# Patient Record
Sex: Female | Born: 1945 | Race: White | Hispanic: No | Marital: Married | Smoking: Never smoker
Health system: Southern US, Community
[De-identification: ages and names within clinical notes are randomized; demographics above are authoritative.]

## PROBLEM LIST (undated history)

## (undated) DIAGNOSIS — E079 Disorder of thyroid, unspecified: Secondary | ICD-10-CM

## (undated) DIAGNOSIS — N200 Calculus of kidney: Secondary | ICD-10-CM

## (undated) DIAGNOSIS — Z87442 Personal history of urinary calculi: Secondary | ICD-10-CM

## (undated) DIAGNOSIS — E7800 Pure hypercholesterolemia, unspecified: Secondary | ICD-10-CM

## (undated) DIAGNOSIS — I10 Essential (primary) hypertension: Secondary | ICD-10-CM

## (undated) HISTORY — DX: Calculus of kidney: N20.0

## (undated) HISTORY — PX: ABDOMINAL HYSTERECTOMY: SHX81

## (undated) HISTORY — DX: Pure hypercholesterolemia, unspecified: E78.00

## (undated) HISTORY — DX: Disorder of thyroid, unspecified: E07.9

---

## 2001-03-02 ENCOUNTER — Other Ambulatory Visit: Admission: RE | Admit: 2001-03-02 | Discharge: 2001-03-02 | Payer: Self-pay | Admitting: Dermatology

## 2017-03-24 ENCOUNTER — Ambulatory Visit (INDEPENDENT_AMBULATORY_CARE_PROVIDER_SITE_OTHER): Payer: Medicare Other | Admitting: Otolaryngology

## 2017-03-24 DIAGNOSIS — R1312 Dysphagia, oropharyngeal phase: Secondary | ICD-10-CM | POA: Diagnosis not present

## 2017-03-24 DIAGNOSIS — D440 Neoplasm of uncertain behavior of thyroid gland: Secondary | ICD-10-CM | POA: Diagnosis not present

## 2017-03-30 ENCOUNTER — Other Ambulatory Visit (INDEPENDENT_AMBULATORY_CARE_PROVIDER_SITE_OTHER): Payer: Self-pay | Admitting: Otolaryngology

## 2017-03-30 DIAGNOSIS — E079 Disorder of thyroid, unspecified: Secondary | ICD-10-CM

## 2017-04-07 ENCOUNTER — Ambulatory Visit (HOSPITAL_COMMUNITY)
Admission: RE | Admit: 2017-04-07 | Discharge: 2017-04-07 | Disposition: A | Discharge status: Home / Self Care | Payer: Medicare Other | Source: Ambulatory Visit | Attending: Otolaryngology | Admitting: Otolaryngology

## 2017-04-07 ENCOUNTER — Encounter (HOSPITAL_COMMUNITY): Payer: Self-pay

## 2017-04-07 DIAGNOSIS — I708 Atherosclerosis of other arteries: Secondary | ICD-10-CM | POA: Diagnosis not present

## 2017-04-07 DIAGNOSIS — E079 Disorder of thyroid, unspecified: Secondary | ICD-10-CM

## 2017-04-07 DIAGNOSIS — E042 Nontoxic multinodular goiter: Secondary | ICD-10-CM | POA: Diagnosis not present

## 2017-04-07 HISTORY — DX: Essential (primary) hypertension: I10

## 2017-04-07 LAB — POCT I-STAT CREATININE: CREATININE: 0.9 mg/dL (ref 0.44–1.00)

## 2017-04-07 MED ORDER — IOPAMIDOL (ISOVUE-300) INJECTION 61%
75.0000 mL | Freq: Once | INTRAVENOUS | Status: AC | PRN
  Administered 2017-04-07: 75 mL via INTRAVENOUS

## 2017-04-21 ENCOUNTER — Ambulatory Visit (INDEPENDENT_AMBULATORY_CARE_PROVIDER_SITE_OTHER): Payer: Medicare Other | Admitting: Otolaryngology

## 2017-04-21 DIAGNOSIS — D440 Neoplasm of uncertain behavior of thyroid gland: Secondary | ICD-10-CM

## 2017-04-26 ENCOUNTER — Other Ambulatory Visit (INDEPENDENT_AMBULATORY_CARE_PROVIDER_SITE_OTHER): Payer: Self-pay | Admitting: Otolaryngology

## 2017-04-26 DIAGNOSIS — E041 Nontoxic single thyroid nodule: Secondary | ICD-10-CM

## 2017-04-27 ENCOUNTER — Other Ambulatory Visit (INDEPENDENT_AMBULATORY_CARE_PROVIDER_SITE_OTHER): Payer: Self-pay | Admitting: Otolaryngology

## 2017-04-27 DIAGNOSIS — E041 Nontoxic single thyroid nodule: Secondary | ICD-10-CM

## 2017-06-01 ENCOUNTER — Ambulatory Visit (HOSPITAL_COMMUNITY): Payer: Medicare Other

## 2017-06-02 ENCOUNTER — Ambulatory Visit (HOSPITAL_COMMUNITY)
Admission: RE | Admit: 2017-06-02 | Discharge: 2017-06-02 | Disposition: A | Discharge status: Home / Self Care | Payer: Medicare Other | Source: Ambulatory Visit | Attending: Otolaryngology | Admitting: Otolaryngology

## 2017-06-02 DIAGNOSIS — E041 Nontoxic single thyroid nodule: Secondary | ICD-10-CM | POA: Diagnosis present

## 2017-06-08 ENCOUNTER — Other Ambulatory Visit (INDEPENDENT_AMBULATORY_CARE_PROVIDER_SITE_OTHER): Payer: Self-pay | Admitting: Otolaryngology

## 2017-06-08 DIAGNOSIS — E041 Nontoxic single thyroid nodule: Secondary | ICD-10-CM

## 2017-06-09 ENCOUNTER — Encounter (HOSPITAL_COMMUNITY): Payer: Self-pay

## 2017-06-09 ENCOUNTER — Ambulatory Visit (HOSPITAL_COMMUNITY)
Admission: RE | Admit: 2017-06-09 | Discharge: 2017-06-09 | Disposition: A | Discharge status: Home / Self Care | Payer: Medicare Other | Source: Ambulatory Visit | Attending: Otolaryngology | Admitting: Otolaryngology

## 2017-06-09 DIAGNOSIS — E041 Nontoxic single thyroid nodule: Secondary | ICD-10-CM | POA: Diagnosis present

## 2017-06-09 MED ORDER — LIDOCAINE HCL (PF) 1 % IJ SOLN
INTRAMUSCULAR | Status: AC
  Administered 2017-06-09: 5 mL
  Filled 2017-06-09: qty 10

## 2017-06-09 MED ORDER — LIDOCAINE HCL (PF) 1 % IJ SOLN
INTRAMUSCULAR | Status: AC
  Filled 2017-06-09: qty 10

## 2017-06-09 NOTE — Discharge Instructions (Signed)
Thyroid Biopsy °The thyroid gland is a butterfly-shaped gland located in the front of the neck. It produces hormones that affect metabolism, growth and development, and body temperature. Thyroid biopsy is a procedure in which small samples of tissue or fluid are removed from the thyroid gland. The samples are then looked at under a microscope to check for abnormalities. This procedure is done to determine the cause of thyroid problems. It may be done to check for infection, cancer, or other thyroid problems. °Two methods may be used for a thyroid biopsy. In one method, a thin needle is inserted through the skin and into the thyroid gland. In the other method, an open incision is made through the skin. °Tell a health care provider about: °· Any allergies you have. °· All medicines you are taking, including vitamins, herbs, eye drops, creams, and over-the-counter medicines. °· Any problems you or family members have had with anesthetic medicines. °· Any blood disorders you have. °· Any surgeries you have had. °· Any medical conditions you have. °What are the risks? °Generally, this is a safe procedure. However, problems can occur and include: °· Bleeding from the procedure site. °· Infection. °· Injury to structures near the thyroid gland. ° °What happens before the procedure? °· Ask your health care provider about: °? Changing or stopping your regular medicines. This is especially important if you are taking diabetes medicines or blood thinners. °? Taking medicines such as aspirin and ibuprofen. These medicines can thin your blood. Do not take these medicines before your procedure if your health care provider asks you not to. °· Do not eat or drink anything after midnight on the night before the procedure or as directed by your health care provider. °· You may have a blood sample taken. °What happens during the procedure? °Either of these methods may be used to perform a thyroid biopsy: °· Fine needle biopsy. You may  be given medicine to help you relax (sedative). You will be asked to lie on your back with your head tipped backward to extend your neck. An area on your neck will be cleaned. A needle will then be inserted through the skin of your neck. You may be asked to avoid coughing, talking, swallowing, or making sounds during some portions of the procedure. The needle will be withdrawn once the tissue or fluid samples have been removed. Pressure may be applied to your neck to reduce swelling and ensure that bleeding has stopped. The samples will be sent to a lab for examination. °· Open biopsy. You will be given medicine to make you sleep (general anesthetic). An incision will be made in your neck. A sample of thyroid tissue will be removed using surgical tools. The tissue sample will be sent for examination. In some cases, the sample may be examined during the biopsy. If that is done and cancer cells are found, some or all of the thyroid gland may be removed. The incision will be closed with stitches. ° °What happens after the procedure? °· Your recovery will be assessed and monitored. °· You may have soreness and tenderness at the site of the biopsy. This should go away after a few days. °· If you had an open biopsy, you may have a hoarse voice or sore throat for a couple days. °· It is your responsibility to get your test results. °This information is not intended to replace advice given to you by your health care provider. Make sure you discuss any questions you have with   your health care provider. °Document Released: 07/04/2007 Document Revised: 05/09/2016 Document Reviewed: 11/29/2013 °Elsevier Interactive Patient Education © 2018 [employer]vier Inc. ° °

## 2018-05-30 ENCOUNTER — Other Ambulatory Visit (INDEPENDENT_AMBULATORY_CARE_PROVIDER_SITE_OTHER): Payer: Self-pay | Admitting: Otolaryngology

## 2018-05-30 DIAGNOSIS — E041 Nontoxic single thyroid nodule: Secondary | ICD-10-CM

## 2018-06-13 ENCOUNTER — Ambulatory Visit (HOSPITAL_COMMUNITY)
Admission: RE | Admit: 2018-06-13 | Discharge: 2018-06-13 | Disposition: A | Discharge status: Home / Self Care | Payer: Medicare Other | Source: Ambulatory Visit | Attending: Otolaryngology | Admitting: Otolaryngology

## 2018-06-13 DIAGNOSIS — E041 Nontoxic single thyroid nodule: Secondary | ICD-10-CM | POA: Insufficient documentation

## 2018-06-15 ENCOUNTER — Ambulatory Visit (INDEPENDENT_AMBULATORY_CARE_PROVIDER_SITE_OTHER): Payer: Medicare Other | Admitting: Otolaryngology

## 2018-06-15 DIAGNOSIS — D440 Neoplasm of uncertain behavior of thyroid gland: Secondary | ICD-10-CM | POA: Diagnosis not present

## 2019-06-14 ENCOUNTER — Ambulatory Visit (INDEPENDENT_AMBULATORY_CARE_PROVIDER_SITE_OTHER): Payer: Medicare Other | Admitting: Otolaryngology

## 2019-06-14 DIAGNOSIS — D440 Neoplasm of uncertain behavior of thyroid gland: Secondary | ICD-10-CM | POA: Diagnosis not present

## 2019-10-02 DIAGNOSIS — L509 Urticaria, unspecified: Secondary | ICD-10-CM | POA: Diagnosis not present

## 2019-10-02 DIAGNOSIS — I10 Essential (primary) hypertension: Secondary | ICD-10-CM | POA: Diagnosis not present

## 2019-10-02 DIAGNOSIS — R300 Dysuria: Secondary | ICD-10-CM | POA: Diagnosis not present

## 2019-10-02 DIAGNOSIS — N390 Urinary tract infection, site not specified: Secondary | ICD-10-CM | POA: Diagnosis not present

## 2019-10-02 DIAGNOSIS — R5383 Other fatigue: Secondary | ICD-10-CM | POA: Diagnosis not present

## 2019-10-02 DIAGNOSIS — Z299 Encounter for prophylactic measures, unspecified: Secondary | ICD-10-CM | POA: Diagnosis not present

## 2019-10-02 DIAGNOSIS — Z789 Other specified health status: Secondary | ICD-10-CM | POA: Diagnosis not present

## 2019-10-18 DIAGNOSIS — L509 Urticaria, unspecified: Secondary | ICD-10-CM | POA: Diagnosis not present

## 2019-10-18 DIAGNOSIS — Z713 Dietary counseling and surveillance: Secondary | ICD-10-CM | POA: Diagnosis not present

## 2019-10-18 DIAGNOSIS — Z299 Encounter for prophylactic measures, unspecified: Secondary | ICD-10-CM | POA: Diagnosis not present

## 2019-10-18 DIAGNOSIS — I10 Essential (primary) hypertension: Secondary | ICD-10-CM | POA: Diagnosis not present

## 2019-11-14 ENCOUNTER — Other Ambulatory Visit (HOSPITAL_COMMUNITY)
Admission: AD | Admit: 2019-11-14 | Discharge: 2019-11-14 | Disposition: A | Discharge status: Home / Self Care | Payer: Medicare Other | Source: Other Acute Inpatient Hospital | Attending: Urology | Admitting: Urology

## 2019-11-14 ENCOUNTER — Other Ambulatory Visit: Payer: Self-pay

## 2019-11-14 ENCOUNTER — Encounter: Payer: Self-pay | Admitting: Urology

## 2019-11-14 ENCOUNTER — Ambulatory Visit (INDEPENDENT_AMBULATORY_CARE_PROVIDER_SITE_OTHER): Payer: Medicare Other | Admitting: Urology

## 2019-11-14 ENCOUNTER — Other Ambulatory Visit: Payer: Self-pay | Admitting: Urology

## 2019-11-14 VITALS — BP 164/75 | HR 96 | Temp 98.7°F | Ht 68.0 in | Wt 195.0 lb

## 2019-11-14 DIAGNOSIS — R300 Dysuria: Secondary | ICD-10-CM | POA: Diagnosis not present

## 2019-11-14 DIAGNOSIS — R310 Gross hematuria: Secondary | ICD-10-CM

## 2019-11-14 DIAGNOSIS — N3021 Other chronic cystitis with hematuria: Secondary | ICD-10-CM | POA: Diagnosis not present

## 2019-11-14 LAB — POCT URINALYSIS DIPSTICK
Glucose, UA: NEGATIVE
Ketones, UA: NEGATIVE
Nitrite, UA: NEGATIVE
Protein, UA: NEGATIVE
Spec Grav, UA: 1.025 (ref 1.010–1.025)
Urobilinogen, UA: NEGATIVE E.U./dL — AB
pH, UA: 5 (ref 5.0–8.0)

## 2019-11-14 LAB — BLADDER SCAN AMB NON-IMAGING: Scan Result: 213.6

## 2019-11-14 NOTE — Progress Notes (Signed)
11/14/2019 4:03 PM   Amanda Case [birth year] [id number]  Referring provider: Monico Blitz, MD [place],  [place] [id number]  Chronic cystitis  HPI: Amanda Case is a 74yo here for evaluation for chronic cystitis, dysuria and gross hematuria. She gets 4-5 UTIs per year starting 3 years ago. She gets worsening LUTS especially dysuria with UTIs. She had 1 episode of gross painless hematuria 6 months ago. She had nephrolithiasis 18 months ago and had CT stone study at [facility]. No tobacco abuse hx. No exposure risks.    PMH: Past Medical History:  Diagnosis Date  . Hypercholesterolemia   . Hypertension   . Kidney stone     Surgical History:   Home Medications:  Allergies as of 11/14/2019      Reactions   Cipro [ciprofloxacin-ciproflox Hcl Er] Hives      Medication List       Accurate as of November 14, 2019  4:03 PM. If you have any questions, ask your nurse or doctor.        hydrochlorothiazide 12.5 MG capsule Commonly known as: MICROZIDE Take 12.5 mg by mouth daily.   losartan 100 MG tablet Commonly known as: COZAAR Take 100 mg by mouth daily.   rosuvastatin 10 MG tablet Commonly known as: CRESTOR Take 10 mg by mouth daily.       Allergies:  Allergies  Allergen Reactions  . Cipro [Ciprofloxacin-Ciproflox Hcl Er] Hives    Family History: No family history on file.  Social History:  reports that she has never smoked. She has never used smokeless tobacco. She reports that she does not drink alcohol or use drugs.  ROS: All other review of systems were reviewed and are negative except what is noted above in HPI  Physical Exam: BP (!) 164/75   Pulse 96   Temp 98.7 F (37.1 C)   Ht 5\' 8"  (1.727 m)   Wt 195 lb (88.5 kg)   BMI 29.65 kg/m   Constitutional:  Alert and oriented, No acute distress. HEENT: [place] AT, moist mucus membranes.  Trachea midline, no masses. Cardiovascular: No clubbing, cyanosis, or edema. Respiratory: Normal respiratory  effort, no increased work of breathing. GI: Abdomen is soft, nontender, nondistended, no abdominal masses GU: No CVA tenderness Lymph: No cervical or inguinal lymphadenopathy. Skin: No rashes, bruises or suspicious lesions. Neurologic: Grossly intact, no focal deficits, moving all 4 extremities. Psychiatric: Normal mood and affect.  Laboratory Data: No results found for: WBC, HGB, HCT, MCV, PLT  Lab Results  Component Value Date   CREATININE 0.90 04/07/2017    No results found for: PSA  No results found for: TESTOSTERONE  No results found for: HGBA1C  Urinalysis    Component Value Date/Time   BILIRUBINUR small 11/14/2019 1557   PROTEINUR Negative 11/14/2019 1557   UROBILINOGEN negative (A) 11/14/2019 1557   NITRITE neg 11/14/2019 1557   LEUKOCYTESUR Large (3+) (A) 11/14/2019 1557    No results found for: LABMICR, WBCUA, RBCUA, LABEPIT, MUCUS, BACTERIA  Pertinent Imaging:  No results found for this or any previous visit. No results found for this or any previous visit. No results found for this or any previous visit. No results found for this or any previous visit. No results found for this or any previous visit. No results found for this or any previous visit. No results found for this or any previous visit. No results found for this or any previous visit.  Assessment & Plan:    1.  Dysuria -urine for culture, will call with results - Bladder Scan (Post Void Residual) in office - POCT urinalysis dipstick  2. Chronic cystitis with hematuria -BMP -CT hematuria -Office cystoscopy   No follow-ups on file.  Nicolette Bang, MD  [facility] [place]

## 2019-11-14 NOTE — Patient Instructions (Signed)
Hematuria, Adult Hematuria is blood in the urine. Blood may be visible in the urine, or it may be identified with a test. This condition can be caused by infections of the bladder, urethra, kidney, or prostate. Other possible causes include:  Kidney stones.  Cancer of the urinary tract.  Too much calcium in the urine.  Conditions that are passed from parent to child (inherited conditions).  Exercise that requires a lot of energy. Infections can usually be treated with medicine, and a kidney stone usually will pass through your urine. If neither of these is the cause of your hematuria, more tests may be needed to identify the cause of your symptoms. It is very important to tell your health care provider about any blood in your urine, even if it is painless or the blood stops without treatment. Blood in the urine, when it happens and then stops and then happens again, can be a symptom of a very serious condition, including cancer. There is no pain in the initial stages of many urinary cancers. Follow these instructions at home: Medicines  Take over-the-counter and prescription medicines only as told by your health care provider.  If you were prescribed an antibiotic medicine, take it as told by your health care provider. Do not stop taking the antibiotic even if you start to feel better. Eating and drinking  Drink enough fluid to keep your urine clear or pale yellow. It is recommended that you drink 3-4 quarts (2.8-3.8 L) a day. If you have been diagnosed with an infection, it is recommended that you drink cranberry juice in addition to large amounts of water.  Avoid caffeine, tea, and carbonated beverages. These tend to irritate the bladder.  Avoid alcohol because it may irritate the prostate (men). General instructions  If you have been diagnosed with a kidney stone, follow your health care provider's instructions about straining your urine to catch the stone.  Empty your bladder  often. Avoid holding urine for long periods of time.  If you are female: ? After a bowel movement, wipe from front to back and use each piece of toilet paper only once. ? Empty your bladder before and after sex.  Pay attention to any changes in your symptoms. Tell your health care provider about any changes or any new symptoms.  It is your responsibility to get your test results. Ask your health care provider, or the department performing the test, when your results will be ready.  Keep all follow-up visits as told by your health care provider. This is important. Contact a health care provider if:  You develop back pain.  You have a fever.  You have nausea or vomiting.  Your symptoms do not improve after 3 days.  Your symptoms get worse. Get help right away if:  You develop severe vomiting and are unable take medicine without vomiting.  You develop severe pain in your back or abdomen even though you are taking medicine.  You pass a large amount of blood in your urine.  You pass blood clots in your urine.  You feel very weak or like you might faint.  You faint. Summary  Hematuria is blood in the urine. It has many possible causes.  It is very important that you tell your health care provider about any blood in your urine, even if it is painless or the blood stops without treatment.  Take over-the-counter and prescription medicines only as told by your health care provider.  Drink enough fluid to keep   your urine clear or pale yellow. This information is not intended to replace advice given to you by your health care provider. Make sure you discuss any questions you have with your health care provider. Document Revised: 01/31/2019 Document Reviewed: 10/09/2016 Elsevier Patient Education  2020 Elsevier Inc.  

## 2019-11-14 NOTE — Progress Notes (Signed)
Urological Symptom Review  Patient is experiencing the following symptoms: Frequent urination Hard to postpone urination Burning/pain with urination Get up at night to urinate Leakage of urine Stream starts and stops Urinary tract infection   Review of Systems  Gastrointestinal (upper)  : Negative for upper GI symptoms  Gastrointestinal (lower) : Negative for lower GI symptoms  Constitutional : Negative for symptoms  Skin: Negative for skin symptoms  Eyes: Negative for eye symptoms  Ear/Nose/Throat : Negative for Ear/Nose/Throat symptoms  Hematologic/Lymphatic: Negative for Hematologic/Lymphatic symptoms  Cardiovascular : Negative for cardiovascular symptoms  Respiratory : Negative for respiratory symptoms  Endocrine: Negative for endocrine symptoms  Musculoskeletal: Negative for musculoskeletal symptoms  Neurological: Negative for neurological symptoms  Psychologic: Negative for psychiatric symptoms

## 2019-11-15 LAB — URINE CULTURE: Culture: >=100000 — AB

## 2019-11-15 LAB — BASIC METABOLIC PANEL
BUN/Creatinine Ratio: 11 (calc) (ref 6–22)
BUN: 11 mg/dL (ref 7–25)
CO2: 29 mmol/L (ref 20–32)
Calcium: 10.1 mg/dL (ref 8.6–10.4)
Chloride: 101 mmol/L (ref 98–110)
Creat: 1 mg/dL — ABNORMAL HIGH (ref 0.60–0.93)
Glucose, Bld: 119 mg/dL (ref 65–139)
Potassium: 4.4 mmol/L (ref 3.5–5.3)
Sodium: 140 mmol/L (ref 135–146)

## 2019-11-16 ENCOUNTER — Other Ambulatory Visit: Payer: Self-pay

## 2019-11-16 ENCOUNTER — Telehealth: Payer: Self-pay

## 2019-11-16 DIAGNOSIS — R300 Dysuria: Secondary | ICD-10-CM

## 2019-11-16 MED ORDER — SULFAMETHOXAZOLE-TRIMETHOPRIM 800-160 MG PO TABS: 1.0000 | ORAL_TABLET | Freq: Two times a day (BID) | ORAL | 0 refills | Status: DC

## 2019-11-16 NOTE — Telephone Encounter (Signed)
-----   Message from Dorisann Frames, RN sent at 11/16/2019  2:05 PM EST ----- Urine culture

## 2019-11-16 NOTE — Telephone Encounter (Signed)
I spoke with Dr. Alyson Ingles for pt urine culture. Order received verbal for Bactrim DS po BID #[id number]. Pt notified. rx sent in.

## 2019-11-16 NOTE — Progress Notes (Signed)
Letter mailed of BMP results

## 2019-11-23 ENCOUNTER — Telehealth: Payer: Self-pay | Admitting: Urology

## 2019-11-23 NOTE — Telephone Encounter (Signed)
Patient requests a nurse return her call. She has questions about Cipro and also a CT question.

## 2019-11-23 NOTE — Telephone Encounter (Signed)
Left message to return call 

## 2019-11-23 NOTE — Telephone Encounter (Signed)
Pt had questions about CT scheduling. Authorization was approved on 3/2. Notified pt they should call within the week but number given to pt to call scheduling if she has not heard by next Friday. Pt voiced understanding.

## 2019-12-06 ENCOUNTER — Other Ambulatory Visit: Payer: Self-pay

## 2019-12-06 ENCOUNTER — Ambulatory Visit (HOSPITAL_COMMUNITY)
Admission: RE | Admit: 2019-12-06 | Discharge: 2019-12-06 | Disposition: A | Discharge status: Home / Self Care | Payer: Medicare Other | Source: Ambulatory Visit | Attending: Urology | Admitting: Urology

## 2019-12-06 ENCOUNTER — Ambulatory Visit (HOSPITAL_COMMUNITY): Admission: RE | Admit: 2019-12-06 | Payer: Medicare Other | Source: Ambulatory Visit

## 2019-12-06 DIAGNOSIS — R310 Gross hematuria: Secondary | ICD-10-CM | POA: Insufficient documentation

## 2019-12-06 DIAGNOSIS — K5730 Diverticulosis of large intestine without perforation or abscess without bleeding: Secondary | ICD-10-CM | POA: Diagnosis not present

## 2019-12-06 DIAGNOSIS — R319 Hematuria, unspecified: Secondary | ICD-10-CM | POA: Diagnosis not present

## 2019-12-06 MED ORDER — IOHEXOL 300 MG/ML  SOLN
150.0000 mL | Freq: Once | INTRAMUSCULAR | Status: AC | PRN
  Administered 2019-12-06: 13:00:00 150 mL via INTRAVENOUS

## 2020-01-02 ENCOUNTER — Ambulatory Visit: Payer: Medicare Other | Admitting: Urology

## 2020-01-02 ENCOUNTER — Other Ambulatory Visit: Payer: Self-pay

## 2020-01-02 ENCOUNTER — Encounter: Payer: Self-pay | Admitting: Urology

## 2020-01-02 VITALS — BP 156/71 | HR 80 | Temp 98.2°F | Ht 68.0 in | Wt 195.0 lb

## 2020-01-02 DIAGNOSIS — N3021 Other chronic cystitis with hematuria: Secondary | ICD-10-CM | POA: Diagnosis not present

## 2020-01-02 LAB — POCT URINALYSIS DIPSTICK
Bilirubin, UA: NEGATIVE
Blood, UA: NEGATIVE
Glucose, UA: NEGATIVE
Ketones, UA: NEGATIVE
Nitrite, UA: NEGATIVE
Protein, UA: NEGATIVE
Spec Grav, UA: <=1.005 — AB (ref 1.010–1.025)
Urobilinogen, UA: NEGATIVE E.U./dL — AB
pH, UA: 7.5 (ref 5.0–8.0)

## 2020-01-02 LAB — BLADDER SCAN AMB NON-IMAGING: Scan Result: 397.3

## 2020-01-02 MED ORDER — NITROFURANTOIN MACROCRYSTAL 50 MG PO CAPS: 50.0000 mg | ORAL_CAPSULE | Freq: Every evening | ORAL | 3 refills | Status: DC

## 2020-01-02 NOTE — Progress Notes (Signed)
   01/02/20  CC: recurrent UTI  HPI: Ms Orcutt is a 74yo here for followup for recurrent UTI. CT hematuria did not show GU abnormalities. She does have a large 15cm pelvic mass compressing bladder.   Blood pressure (!) 156/71, pulse 80, temperature 98.2 F (36.8 C), height 5\' 8"  (1.727 m), weight 195 lb (88.5 kg). NED. A&Ox3.   No respiratory distress   Abd soft, NT, ND Normal external genitalia with patent urethral meatus  Cystoscopy Procedure Note  Patient identification was confirmed, informed consent was obtained, and patient was prepped using Betadine solution.  Lidocaine jelly was administered per urethral meatus.    Procedure: - Flexible cystoscope introduced, without any difficulty.   - Thorough search of the bladder revealed:    normal urethral meatus    normal urothelium    no stones    no ulcers     no tumors    no urethral polyps    no trabeculation -Bladder compressed at dome from pelvic mass.   - Ureteral orifices were normal in position and appearance.  Post-Procedure: - Patient tolerated the procedure well  Assessment/ Plan: -I discussed the Ct findings with the patient. I instructed her to call her Gynecologist in [place] to schedule appointment to discuss uterine mass.   RTC 3 months   No follow-ups on file.  Nicolette Bang, MD

## 2020-01-02 NOTE — Progress Notes (Signed)

## 2020-01-02 NOTE — Patient Instructions (Signed)
Urinary Tract Infection, Adult A urinary tract infection (UTI) is an infection of any part of the urinary tract. The urinary tract includes:  The kidneys.  The ureters.  The bladder.  The urethra. These organs make, store, and get rid of pee (urine) in the body. What are the causes? This is caused by germs (bacteria) in your genital area. These germs grow and cause swelling (inflammation) of your urinary tract. What increases the risk? You are more likely to develop this condition if:  You have a small, thin tube (catheter) to drain pee.  You cannot control when you pee or poop (incontinence).  You are female, and: ? You use these methods to prevent pregnancy:  A medicine that kills sperm (spermicide).  A device that blocks sperm (diaphragm). ? You have low levels of a female hormone (estrogen). ? You are pregnant.  You have genes that add to your risk.  You are sexually active.  You take antibiotic medicines.  You have trouble peeing because of: ? A prostate that is bigger than normal, if you are female. ? A blockage in the part of your body that drains pee from the bladder (urethra). ? A kidney stone. ? A nerve condition that affects your bladder (neurogenic bladder). ? Not getting enough to drink. ? Not peeing often enough.  You have other conditions, such as: ? Diabetes. ? A weak disease-fighting system (immune system). ? Sickle cell disease. ? Gout. ? Injury of the spine. What are the signs or symptoms? Symptoms of this condition include:  Needing to pee right away (urgently).  Peeing often.  Peeing small amounts often.  Pain or burning when peeing.  Blood in the pee.  Pee that smells bad or not like normal.  Trouble peeing.  Pee that is cloudy.  Fluid coming from the vagina, if you are female.  Pain in the belly or lower back. Other symptoms include:  Throwing up (vomiting).  No urge to eat.  Feeling mixed up (confused).  Being tired  and grouchy (irritable).  A fever.  Watery poop (diarrhea). How is this treated? This condition may be treated with:  Antibiotic medicine.  Other medicines.  Drinking enough water. Follow these instructions at home:  Medicines  Take over-the-counter and prescription medicines only as told by your doctor.  If you were prescribed an antibiotic medicine, take it as told by your doctor. Do not stop taking it even if you start to feel better. General instructions  Make sure you: ? Pee until your bladder is empty. ? Do not hold pee for a long time. ? Empty your bladder after sex. ? Wipe from front to back after pooping if you are a female. Use each tissue one time when you wipe.  Drink enough fluid to keep your pee pale yellow.  Keep all follow-up visits as told by your doctor. This is important. Contact a doctor if:  You do not get better after 1-2 days.  Your symptoms go away and then come back. Get help right away if:  You have very bad back pain.  You have very bad pain in your lower belly.  You have a fever.  You are sick to your stomach (nauseous).  You are throwing up. Summary  A urinary tract infection (UTI) is an infection of any part of the urinary tract.  This condition is caused by germs in your genital area.  There are many risk factors for a UTI. These include having a small, thin   tube to drain pee and not being able to control when you pee or poop.  Treatment includes antibiotic medicines for germs.  Drink enough fluid to keep your pee pale yellow. This information is not intended to replace advice given to you by your health care provider. Make sure you discuss any questions you have with your health care provider. Document Revised: 08/24/2018 Document Reviewed: 03/16/2018 Elsevier Patient Education  2020 Elsevier Inc.  

## 2020-01-08 DIAGNOSIS — R1900 Intra-abdominal and pelvic swelling, mass and lump, unspecified site: Secondary | ICD-10-CM | POA: Diagnosis not present

## 2020-01-15 DIAGNOSIS — R1900 Intra-abdominal and pelvic swelling, mass and lump, unspecified site: Secondary | ICD-10-CM | POA: Diagnosis not present

## 2020-03-05 DIAGNOSIS — N3000 Acute cystitis without hematuria: Secondary | ICD-10-CM | POA: Diagnosis not present

## 2020-03-05 DIAGNOSIS — Z87440 Personal history of urinary (tract) infections: Secondary | ICD-10-CM | POA: Diagnosis not present

## 2020-03-05 DIAGNOSIS — R1900 Intra-abdominal and pelvic swelling, mass and lump, unspecified site: Secondary | ICD-10-CM | POA: Diagnosis not present

## 2020-03-18 DIAGNOSIS — Z1211 Encounter for screening for malignant neoplasm of colon: Secondary | ICD-10-CM | POA: Diagnosis not present

## 2020-03-18 DIAGNOSIS — Z0000 Encounter for general adult medical examination without abnormal findings: Secondary | ICD-10-CM | POA: Diagnosis not present

## 2020-03-18 DIAGNOSIS — R5383 Other fatigue: Secondary | ICD-10-CM | POA: Diagnosis not present

## 2020-03-18 DIAGNOSIS — Z7189 Other specified counseling: Secondary | ICD-10-CM | POA: Diagnosis not present

## 2020-03-18 DIAGNOSIS — E7800 Pure hypercholesterolemia, unspecified: Secondary | ICD-10-CM | POA: Diagnosis not present

## 2020-03-18 DIAGNOSIS — Z299 Encounter for prophylactic measures, unspecified: Secondary | ICD-10-CM | POA: Diagnosis not present

## 2020-03-18 DIAGNOSIS — Z79899 Other long term (current) drug therapy: Secondary | ICD-10-CM | POA: Diagnosis not present

## 2020-03-18 DIAGNOSIS — I10 Essential (primary) hypertension: Secondary | ICD-10-CM | POA: Diagnosis not present

## 2020-04-02 ENCOUNTER — Ambulatory Visit: Payer: Medicare Other | Admitting: Urology

## 2020-04-11 DIAGNOSIS — R1900 Intra-abdominal and pelvic swelling, mass and lump, unspecified site: Secondary | ICD-10-CM | POA: Diagnosis not present

## 2020-05-21 ENCOUNTER — Other Ambulatory Visit (HOSPITAL_COMMUNITY): Payer: Self-pay | Admitting: Otolaryngology

## 2020-05-21 DIAGNOSIS — E041 Nontoxic single thyroid nodule: Secondary | ICD-10-CM

## 2020-06-02 DIAGNOSIS — Z01812 Encounter for preprocedural laboratory examination: Secondary | ICD-10-CM | POA: Diagnosis not present

## 2020-06-02 DIAGNOSIS — D219 Benign neoplasm of connective and other soft tissue, unspecified: Secondary | ICD-10-CM | POA: Diagnosis not present

## 2020-06-04 DIAGNOSIS — Z01812 Encounter for preprocedural laboratory examination: Secondary | ICD-10-CM | POA: Diagnosis not present

## 2020-06-25 DIAGNOSIS — R109 Unspecified abdominal pain: Secondary | ICD-10-CM | POA: Diagnosis not present

## 2020-06-25 DIAGNOSIS — R300 Dysuria: Secondary | ICD-10-CM | POA: Diagnosis not present

## 2020-06-25 DIAGNOSIS — R5082 Postprocedural fever: Secondary | ICD-10-CM | POA: Diagnosis not present

## 2020-06-26 DIAGNOSIS — T8143XA Infection following a procedure, organ and space surgical site, initial encounter: Secondary | ICD-10-CM | POA: Diagnosis not present

## 2020-06-26 DIAGNOSIS — K5730 Diverticulosis of large intestine without perforation or abscess without bleeding: Secondary | ICD-10-CM | POA: Diagnosis not present

## 2020-06-26 DIAGNOSIS — I700 Atherosclerosis of aorta: Secondary | ICD-10-CM | POA: Diagnosis not present

## 2020-07-02 DIAGNOSIS — Z978 Presence of other specified devices: Secondary | ICD-10-CM | POA: Diagnosis not present

## 2020-07-02 DIAGNOSIS — I700 Atherosclerosis of aorta: Secondary | ICD-10-CM | POA: Diagnosis not present

## 2020-07-02 DIAGNOSIS — K5730 Diverticulosis of large intestine without perforation or abscess without bleeding: Secondary | ICD-10-CM | POA: Diagnosis not present

## 2020-07-11 DIAGNOSIS — Z978 Presence of other specified devices: Secondary | ICD-10-CM | POA: Diagnosis not present

## 2020-07-11 DIAGNOSIS — I700 Atherosclerosis of aorta: Secondary | ICD-10-CM | POA: Diagnosis not present

## 2020-07-11 DIAGNOSIS — K5730 Diverticulosis of large intestine without perforation or abscess without bleeding: Secondary | ICD-10-CM | POA: Diagnosis not present

## 2020-07-11 DIAGNOSIS — N1330 Unspecified hydronephrosis: Secondary | ICD-10-CM | POA: Diagnosis not present

## 2020-07-29 DIAGNOSIS — K5730 Diverticulosis of large intestine without perforation or abscess without bleeding: Secondary | ICD-10-CM | POA: Diagnosis not present

## 2020-07-29 DIAGNOSIS — K651 Peritoneal abscess: Secondary | ICD-10-CM | POA: Diagnosis not present

## 2020-07-29 DIAGNOSIS — N1330 Unspecified hydronephrosis: Secondary | ICD-10-CM | POA: Diagnosis not present

## 2020-08-18 DIAGNOSIS — Z1231 Encounter for screening mammogram for malignant neoplasm of breast: Secondary | ICD-10-CM | POA: Diagnosis not present

## 2020-08-26 DIAGNOSIS — Z299 Encounter for prophylactic measures, unspecified: Secondary | ICD-10-CM | POA: Diagnosis not present

## 2020-08-26 DIAGNOSIS — I10 Essential (primary) hypertension: Secondary | ICD-10-CM | POA: Diagnosis not present

## 2020-08-26 DIAGNOSIS — Z713 Dietary counseling and surveillance: Secondary | ICD-10-CM | POA: Diagnosis not present

## 2020-08-29 DIAGNOSIS — N1330 Unspecified hydronephrosis: Secondary | ICD-10-CM | POA: Diagnosis not present

## 2020-08-29 DIAGNOSIS — Z01812 Encounter for preprocedural laboratory examination: Secondary | ICD-10-CM | POA: Diagnosis not present

## 2020-08-29 DIAGNOSIS — M47816 Spondylosis without myelopathy or radiculopathy, lumbar region: Secondary | ICD-10-CM | POA: Diagnosis not present

## 2020-08-29 DIAGNOSIS — K449 Diaphragmatic hernia without obstruction or gangrene: Secondary | ICD-10-CM | POA: Diagnosis not present

## 2020-09-23 DIAGNOSIS — Z4682 Encounter for fitting and adjustment of non-vascular catheter: Secondary | ICD-10-CM | POA: Diagnosis not present

## 2020-11-27 DIAGNOSIS — R5383 Other fatigue: Secondary | ICD-10-CM | POA: Diagnosis not present

## 2020-11-27 DIAGNOSIS — I10 Essential (primary) hypertension: Secondary | ICD-10-CM | POA: Diagnosis not present

## 2020-11-27 DIAGNOSIS — R300 Dysuria: Secondary | ICD-10-CM | POA: Diagnosis not present

## 2020-11-27 DIAGNOSIS — Z299 Encounter for prophylactic measures, unspecified: Secondary | ICD-10-CM | POA: Diagnosis not present

## 2020-12-12 ENCOUNTER — Encounter: Payer: Self-pay | Admitting: Urology

## 2020-12-12 ENCOUNTER — Ambulatory Visit (INDEPENDENT_AMBULATORY_CARE_PROVIDER_SITE_OTHER): Payer: Medicare Other | Admitting: Urology

## 2020-12-12 ENCOUNTER — Ambulatory Visit (HOSPITAL_COMMUNITY)
Admission: RE | Admit: 2020-12-12 | Discharge: 2020-12-12 | Disposition: A | Discharge status: Home / Self Care | Payer: Medicare Other | Source: Ambulatory Visit | Attending: Otolaryngology | Admitting: Otolaryngology

## 2020-12-12 ENCOUNTER — Other Ambulatory Visit: Payer: Self-pay

## 2020-12-12 ENCOUNTER — Ambulatory Visit (HOSPITAL_COMMUNITY)
Admission: RE | Admit: 2020-12-12 | Discharge: 2020-12-12 | Disposition: A | Discharge status: Home / Self Care | Payer: Medicare Other | Source: Ambulatory Visit | Attending: Urology | Admitting: Urology

## 2020-12-12 VITALS — BP 163/83 | HR 83 | Temp 98.8°F | Resp 14 | Ht 66.0 in | Wt 186.0 lb

## 2020-12-12 DIAGNOSIS — I700 Atherosclerosis of aorta: Secondary | ICD-10-CM | POA: Diagnosis not present

## 2020-12-12 DIAGNOSIS — N1330 Unspecified hydronephrosis: Secondary | ICD-10-CM

## 2020-12-12 DIAGNOSIS — R310 Gross hematuria: Secondary | ICD-10-CM

## 2020-12-12 DIAGNOSIS — R3912 Poor urinary stream: Secondary | ICD-10-CM | POA: Diagnosis not present

## 2020-12-12 DIAGNOSIS — Z87442 Personal history of urinary calculi: Secondary | ICD-10-CM | POA: Diagnosis not present

## 2020-12-12 DIAGNOSIS — N3941 Urge incontinence: Secondary | ICD-10-CM | POA: Diagnosis not present

## 2020-12-12 DIAGNOSIS — K5750 Diverticulosis of both small and large intestine without perforation or abscess without bleeding: Secondary | ICD-10-CM | POA: Diagnosis not present

## 2020-12-12 DIAGNOSIS — E041 Nontoxic single thyroid nodule: Secondary | ICD-10-CM

## 2020-12-12 DIAGNOSIS — E042 Nontoxic multinodular goiter: Secondary | ICD-10-CM | POA: Diagnosis not present

## 2020-12-12 LAB — MICROSCOPIC EXAMINATION: Renal Epithel, UA: NONE SEEN /hpf

## 2020-12-12 LAB — URINALYSIS, ROUTINE W REFLEX MICROSCOPIC
Bilirubin, UA: NEGATIVE
Glucose, UA: NEGATIVE
Ketones, UA: NEGATIVE
Leukocytes,UA: NEGATIVE
Nitrite, UA: NEGATIVE
Protein,UA: NEGATIVE
Specific Gravity, UA: 1.01 (ref 1.005–1.030)
Urobilinogen, Ur: 0.2 mg/dL (ref 0.2–1.0)
pH, UA: 7 (ref 5.0–7.5)

## 2020-12-12 NOTE — Progress Notes (Signed)
12/12/2020 10:06 AM   Amanda Case [birth year] [id number]  Referring provider: Monico Blitz, MD [address] [place],  [place] [address]  Chief Complaint  Patient presents with  . Urinary Incontinence  . Follow-up    HPI: Amanda Case is a 75yo here for followup for chronic cystitis. She had a hysterectomy which was complicated by a pelvic abscess and had her drains removed 09/2020. She underwent a CT in 08/2020 which showed new moderate right hydronephrosis. She has intermittent right flank pain. Creatinine has increased to 1.35 from 0.9 1 year ago.  STAT CT stone study shows severe right hydronephrosis with a transition point in her pelvis at the level of the hysterectomy.    PMH: Past Medical History:  Diagnosis Date  . Hypercholesterolemia   . Hypertension   . Kidney stone     Surgical History: No past surgical history on file.  Home Medications:  Allergies as of 12/12/2020      Reactions   Cipro [ciprofloxacin-ciproflox Hcl Er] Hives      Medication List       Accurate as of December 12, 2020 10:06 AM. If you have any questions, ask your nurse or doctor.        STOP taking these medications   nitrofurantoin 50 MG capsule Commonly known as: MACRODANTIN Stopped by: Nicolette Bang, MD   sulfamethoxazole-trimethoprim 800-160 MG tablet Commonly known as: BACTRIM DS Stopped by: Nicolette Bang, MD     TAKE these medications   aspirin EC 81 MG tablet Take 81 mg by mouth daily. Swallow whole.   cholecalciferol 25 MCG (1000 UNIT) tablet Commonly known as: VITAMIN D3 Take 1,000 Units by mouth daily.   hydrochlorothiazide 12.5 MG capsule Commonly known as: MICROZIDE Take 12.5 mg by mouth daily.   losartan 100 MG tablet Commonly known as: COZAAR Take 100 mg by mouth daily.   rosuvastatin 10 MG tablet Commonly known as: CRESTOR Take 10 mg by mouth daily.   vitamin B-12 1000 MCG tablet Commonly known as: CYANOCOBALAMIN Take 1,000 mcg by mouth daily.        Allergies:  Allergies  Allergen Reactions  . Cipro [Ciprofloxacin-Ciproflox Hcl Er] Hives    Family History: No family history on file.  Social History:  reports that she has never smoked. She has never used smokeless tobacco. She reports that she does not drink alcohol and does not use drugs.  ROS: All other review of systems were reviewed and are negative except what is noted above in HPI  Physical Exam: BP (!) 163/83   Pulse 83   Temp 98.8 F (37.1 C) (Other (Comment))   Resp 14   Ht 5\' 6"  (1.676 m)   Wt 186 lb (84.4 kg)   BMI 30.02 kg/m   Constitutional:  Alert and oriented, No acute distress. HEENT: [place] AT, moist mucus membranes.  Trachea midline, no masses. Cardiovascular: No clubbing, cyanosis, or edema. Respiratory: Normal respiratory effort, no increased work of breathing. GI: Abdomen is soft, nontender, nondistended, no abdominal masses GU: No CVA tenderness.  Lymph: No cervical or inguinal lymphadenopathy. Skin: No rashes, bruises or suspicious lesions. Neurologic: Grossly intact, no focal deficits, moving all 4 extremities. Psychiatric: Normal mood and affect.  Laboratory Data: No results found for: WBC, HGB, HCT, MCV, PLT  Lab Results  Component Value Date   CREATININE 1.00 (H) 11/14/2019    No results found for: PSA  No results found for: TESTOSTERONE  No results found for: HGBA1C  Urinalysis  Component Value Date/Time   BILIRUBINUR neg 01/02/2020 0913   PROTEINUR Negative 01/02/2020 0913   UROBILINOGEN negative (A) 01/02/2020 0913   NITRITE neg 01/02/2020 0913   LEUKOCYTESUR Large (3+) (A) 01/02/2020 0913    No results found for: LABMICR, Regino Ramirez, RBCUA, LABEPIT, MUCUS, BACTERIA  Pertinent Imaging: CT stone study today: Images reviewed and discussed with the patient No results found for this or any previous visit.  No results found for this or any previous visit.  No results found for this or any previous visit.  No results  found for this or any previous visit.  No results found for this or any previous visit.  No results found for this or any previous visit.  Results for orders placed during the hospital encounter of 12/06/19  CT HEMATURIA WORKUP  Narrative CLINICAL DATA:  Chronic UTI for 2 years. Hematuria.  EXAM: CT ABDOMEN AND PELVIS WITHOUT AND WITH CONTRAST  TECHNIQUE: Multidetector CT imaging of the abdomen and pelvis was performed following the standard protocol before and following the bolus administration of intravenous contrast.  CONTRAST:  111mL OMNIPAQUE IOHEXOL 300 MG/ML  SOLN  COMPARISON:  No prior images are available for review. Report from 2018 is noted.  FINDINGS: Lower chest: Basilar atelectasis. No effusion.  Hepatobiliary: Mildly lobular hepatic contours. No signs of focal hepatic lesion. Biliary tree is normal. Portal vein is patent.  Pancreas: Pancreas without signs of inflammation, ductal dilation or focal lesion.  Spleen: Spleen is normal size without focal lesion.  Adrenals/Urinary Tract: Adrenal glands are normal.  Renal enhancement is symmetric. No evidence of hydronephrosis. Urinary bladder displaced by large pelvic mass. No suspicious renal lesion.  Excretory phase with limited assessment of the distal ureters. No secondary signs of stricture. The no lesion in the upper tracts.  No urinary tract calculus.  Stomach/Bowel: Small hiatal hernia. No bowel obstruction or acute bowel process. Normal appendix. Sigmoid diverticulosis.  Vascular/Lymphatic: Atheromatous changes in the abdominal aorta. No aneurysm. No adenopathy in the retroperitoneum or in the upper abdomen. No pelvic nodal enlargement. Hazy central small bowel mesentery with scattered small lymph nodes.  Reproductive: Uterus is not identifiable as a separate structure. There is a large mass in the pelvis with some subtle peripheral calcification. Lesion does not show infiltrative margins  and measures approximately 15.1 x 14.7 x 11.2 cm. The uterus appears to be displaced along the left lateral margin of the mass.  The right and left ovary are likely also displaced but not well defined as discrete structures on the current CT study with engorgement of ovarian veins more so on the right than the left.  Other: No ascites.  Musculoskeletal: Spinal degenerative changes without acute or destructive bone finding.  IMPRESSION: 1. 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 There are no infiltrative margins aside from the inability to separate discrete pelvic reproductive structures from this process. Also the absence of adenopathy is reassuring. There is an ultrasound report available from 2007 that also references a mass. Correlate with any surgeries in the interim and with follow-up pelvic imaging with ultrasound or MRI as warranted. 2. No evidence of urinary tract calculus or obstruction. No signs of upper tract lesion to explain hematuria though the mid right ureter and distal right ureter show limited assessment due to lack of opacification. 3. Mildly lobular hepatic contours may reflect underlying cirrhosis. No focal hepatic lesion. 4. Sigmoid diverticulosis. 5. Small hiatal hernia. 6.  Aortic atherosclerosis.  Aortic Atherosclerosis (ICD10-I70.0).   Electronically Signed By: Zetta Bills M.D. On: 12/07/2019 09:09  No results found for this or any previous visit.   Assessment & Plan:    1. Right hydronephrosis -I discussed the options including diagnostic ureteroscopy with balloon dilation of the ureter and stent placement. After discussing the treatment the patient wishes to proceed with surgery. Risks/benefits/alternatives discussed   No follow-ups on file.  Nicolette Bang, MD  [facility] [place]

## 2020-12-12 NOTE — H&P (View-Only) (Signed)
12/12/2020 10:06 AM   Amanda Case 16-Mar-1946 [id number]  Referring provider: Monico Blitz, MD [address] [place],  [place] [address]  Chief Complaint  Patient presents with  . Urinary Incontinence  . Follow-up    HPI: Amanda Case is a 75yo here for followup for chronic cystitis. She had a hysterectomy which was complicated by a pelvic abscess and had her drains removed 09/2020. She underwent a CT in 08/2020 which showed new moderate right hydronephrosis. She has intermittent right flank pain. Creatinine has increased to 1.35 from 0.9 1 year ago.  STAT CT stone study shows severe right hydronephrosis with a transition point in her pelvis Case the level of the hysterectomy.    PMH: Past Medical History:  Diagnosis Date  . Hypercholesterolemia   . Hypertension   . Kidney stone     Surgical History: No past surgical history on file.  Home Medications:  Allergies as of 12/12/2020      Reactions   Cipro [ciprofloxacin-ciproflox Hcl Er] Hives      Medication List       Accurate as of December 12, 2020 10:06 AM. If you have any questions, ask your nurse or doctor.        STOP taking these medications   nitrofurantoin 50 MG capsule Commonly known as: MACRODANTIN Stopped by: Nicolette Bang, MD   sulfamethoxazole-trimethoprim 800-160 MG tablet Commonly known as: BACTRIM DS Stopped by: Nicolette Bang, MD     TAKE these medications   aspirin EC 81 MG tablet Take 81 mg by mouth daily. Swallow whole.   cholecalciferol 25 MCG (1000 UNIT) tablet Commonly known as: VITAMIN D3 Take 1,000 Units by mouth daily.   hydrochlorothiazide 12.5 MG capsule Commonly known as: MICROZIDE Take 12.5 mg by mouth daily.   losartan 100 MG tablet Commonly known as: COZAAR Take 100 mg by mouth daily.   rosuvastatin 10 MG tablet Commonly known as: CRESTOR Take 10 mg by mouth daily.   vitamin B-12 1000 MCG tablet Commonly known as: CYANOCOBALAMIN Take 1,000 mcg by mouth daily.        Allergies:  Allergies  Allergen Reactions  . Cipro [Ciprofloxacin-Ciproflox Hcl Er] Hives    Family History: No family history on file.  Social History:  reports that she has never smoked. She has never used smokeless tobacco. She reports that she does not drink alcohol and does not use drugs.  ROS: All other review of systems were reviewed and are negative except what is noted above in HPI  Physical Exam: BP (!) 163/83   Pulse 83   Temp 98.8 F (37.1 C) (Other (Comment))   Resp 14   Ht 5\' 6"  (1.676 m)   Wt 186 lb (84.4 kg)   BMI 30.02 kg/m   Constitutional:  Alert and oriented, No acute distress. HEENT: Amanda Case, moist mucus membranes.  Trachea midline, no masses. Cardiovascular: No clubbing, cyanosis, or edema. Respiratory: Normal respiratory effort, no increased work of breathing. GI: Abdomen is soft, nontender, nondistended, no abdominal masses GU: No CVA tenderness.  Lymph: No cervical or inguinal lymphadenopathy. Skin: No rashes, bruises or suspicious lesions. Neurologic: Grossly intact, no focal deficits, moving all 4 extremities. Psychiatric: Normal mood and affect.  Laboratory Data: No results found for: WBC, HGB, HCT, MCV, PLT  Lab Results  Component Value Date   CREATININE 1.00 (H) 11/14/2019    No results found for: PSA  No results found for: TESTOSTERONE  No results found for: HGBA1C  Urinalysis  Component Value Date/Time   BILIRUBINUR neg 01/02/2020 0913   PROTEINUR Negative 01/02/2020 0913   UROBILINOGEN negative (A) 01/02/2020 0913   NITRITE neg 01/02/2020 0913   LEUKOCYTESUR Large (3+) (A) 01/02/2020 0913    No results found for: LABMICR, Amanda Case, RBCUA, LABEPIT, MUCUS, BACTERIA  Pertinent Imaging: CT stone study today: Images reviewed and discussed with the patient No results found for this or any previous visit.  No results found for this or any previous visit.  No results found for this or any previous visit.  No results  found for this or any previous visit.  No results found for this or any previous visit.  No results found for this or any previous visit.  Results for orders placed during the hospital encounter of 12/06/19  CT HEMATURIA WORKUP  Narrative CLINICAL DATA:  Chronic UTI for 2 years. Hematuria.  EXAM: CT ABDOMEN AND PELVIS WITHOUT AND WITH CONTRAST  TECHNIQUE: Multidetector CT imaging of the abdomen and pelvis was performed following the standard protocol before and following the bolus administration of intravenous contrast.  CONTRAST:  151mL OMNIPAQUE IOHEXOL 300 MG/ML  SOLN  COMPARISON:  No prior images are available for review. Report from 2018 is noted.  FINDINGS: Lower chest: Basilar atelectasis. No effusion.  Hepatobiliary: Mildly lobular hepatic contours. No signs of focal hepatic lesion. Biliary tree is normal. Portal vein is patent.  Pancreas: Pancreas without signs of inflammation, ductal dilation or focal lesion.  Spleen: Spleen is normal size without focal lesion.  Adrenals/Urinary Tract: Adrenal glands are normal.  Renal enhancement is symmetric. No evidence of hydronephrosis. Urinary bladder displaced by large pelvic mass. No suspicious renal lesion.  Excretory phase with limited assessment of the distal ureters. No secondary signs of stricture. The no lesion in the upper tracts.  No urinary tract calculus.  Stomach/Bowel: Small hiatal hernia. No bowel obstruction or acute bowel process. Normal appendix. Sigmoid diverticulosis.  Vascular/Lymphatic: Atheromatous changes in the abdominal aorta. No aneurysm. No adenopathy in the retroperitoneum or in the upper abdomen. No pelvic nodal enlargement. Hazy central small bowel mesentery with scattered small lymph nodes.  Reproductive: Uterus is not identifiable as a separate structure. There is a large mass in the pelvis with some subtle peripheral calcification. Lesion does not show infiltrative margins  and measures approximately 15.1 x 14.7 x 11.2 cm. The uterus appears to be displaced along the left lateral margin of the mass.  The right and left ovary are likely also displaced but not well defined as discrete structures on the current CT study with engorgement of ovarian veins more so on the right than the left.  Other: No ascites.  Musculoskeletal: Spinal degenerative changes without acute or destructive bone finding.  IMPRESSION: 1. 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 There are no infiltrative margins aside from the inability to separate discrete pelvic reproductive structures from this process. Also the absence of adenopathy is reassuring. There is an ultrasound report available from 2007 that also references a mass. Correlate with any surgeries in the interim and with follow-up pelvic imaging with ultrasound or MRI as warranted. 2. No evidence of urinary tract calculus or obstruction. No signs of upper tract lesion to explain hematuria though the mid right ureter and distal right ureter show limited assessment due to lack of opacification. 3. Mildly lobular hepatic contours may reflect underlying cirrhosis. No focal hepatic lesion. 4. Sigmoid diverticulosis. 5. Small hiatal hernia. 6.  Aortic atherosclerosis.  Aortic Atherosclerosis (ICD10-I70.0).   Electronically Signed By: Zetta Bills M.D. On: 12/07/2019 09:09  No results found for this or any previous visit.   Assessment & Plan:    1. Right hydronephrosis -I discussed the options including diagnostic ureteroscopy with balloon dilation of the ureter and stent placement. After discussing the treatment the patient wishes to proceed with surgery. Risks/benefits/alternatives discussed   No follow-ups on file.  Nicolette Bang, MD  [facility] [place]

## 2020-12-12 NOTE — Patient Instructions (Signed)
Hydronephrosis  Hydronephrosis is the swelling of one or both kidneys due to a blockage that stops urine from flowing out of the body. Kidneys filter waste from the blood and produce urine. This condition can lead to kidney failure and may become life-threatening if not treated promptly. What are the causes? In infants and children, common causes include problems that occur when a baby is developing in the womb. These can include problems in the kidneys or in the tubes that drain urine into the bladder (ureters). In adults, common causes include:  Kidney stones.  Pregnancy.  A tumor or cyst in the abdomen or pelvis.  An enlarged prostate gland. Other causes include:  Bladder infection.  Scar tissue from a previous surgery or injury.  A blood clot.  Cancer of the prostate, bladder, uterus, ovary, or colon. What are the signs or symptoms? Symptoms of this condition include:  Pain or discomfort in your side (flank) or abdomen.  Swelling in your abdomen.  Nausea and vomiting.  Fever.  Pain when passing urine.  Feelings of urgency when you need to urinate.  Urinating more often than normal. In some cases, you may not have any symptoms. How is this diagnosed? This condition may be diagnosed based on:  Your symptoms and medical history.  A physical exam.  Blood and urine tests.  Imaging tests, such as an ultrasound, CT scan, or MRI.  A procedure to look at your urinary tract and bladder by inserting a scope into the urethra (cystoscopy). How is this treated? Treatment for this condition depends on where the blockage is, how long it has been there, and what caused it. The goal of treatment is to remove the blockage. Treatment may include:  Antibiotic medicines to treat or prevent infection.  A procedure to place a small, thin tube (stent) into a blocked ureter. The stent will keep the ureter open so that urine can drain through it.  A nonsurgical procedure that  crushes kidney stones with shock waves (extracorporeal shock wave lithotripsy).  If kidney failure occurs, treatment may include dialysis or a kidney transplant. Follow these instructions at home:  Take over-the-counter and prescription medicines only as told by your health care provider.  If you were prescribed an antibiotic medicine, take it exactly as told by your health care provider. Do not stop taking the antibiotic even if you start to feel better.  Rest and return to your normal activities as told by your health care provider. Ask your health care provider what activities are safe for you.  Drink enough fluid to keep your urine pale yellow.  Keep all follow-up visits. This is important.   Contact a health care provider if:  You continue to have symptoms after treatment.  You develop new symptoms.  Your urine becomes cloudy or bloody.  You have a fever. Get help right away if:  You have severe flank or abdominal pain.  You cannot drink fluids without vomiting. Summary  Hydronephrosis is the swelling of one or both kidneys due to a blockage that stops urine from flowing out of the body.  Hydronephrosis can lead to kidney failure and may become life-threatening if not treated promptly.  The goal of treatment is to remove the blockage. It may include a procedure to insert a stent into a blocked ureter, a procedure to break up kidney stones, or taking antibiotic medicines.  Follow your health care provider's instructions for taking care of yourself at home, including instructions about drinking fluids, taking   medicines, and limiting activities. This information is not intended to replace advice given to you by your health care provider. Make sure you discuss any questions you have with your health care provider. Document Revised: 12/25/2019 Document Reviewed: 12/25/2019 Elsevier Patient Education  2021 [employer]  

## 2020-12-16 NOTE — Patient Instructions (Signed)
Amanda Case  12/16/2020     @PREFPERIOPPHARMACY @   Your procedure is scheduled on  12/19/2020   Report to [facility] at  [email or web address].   Call this number if you have problems the morning of surgery:  [phone]   Remember:  Do not eat or drink after midnight.                   Take these medicines the morning of surgery with A SIP OF WATER  None.   Place clean sheets on your bed the night before your procedure. DO NOT sleep with pets this night.  Shower with CHG the night before and the morning of your procedure. DO NOT put CHG on your face, hair or genitals.  After each shower, dry off with a clean towel, put on clean, comfortable clothes and brush your teeth.       Do not wear jewelry, make-up or nail polish.  Do not wear lotions, powders, or perfumes, or deodorant.  Do not shave 48 hours prior to surgery.  Men may shave face and neck.  Do not bring valuables to the hospital.  [facility] is not responsible for any belongings or valuables.   Contacts, dentures or bridgework may not be worn into surgery.  Leave your suitcase in the car.  After surgery it may be brought to your room.  For patients admitted to the hospital, discharge time will be determined by your treatment team.  Patients discharged the day of surgery will not be allowed to drive home and must have someone with them for 24 hours.   Special instructions:  DO NOT SMOKE TOBACCO OR VAPE THE MORNING OF YOUR PROCEDURE.   Please read over the following fact sheets that you were given. Coughing and Deep Breathing, Surgical Site Infection Prevention, Anesthesia Post-op Instructions and Care and Recovery After Surgery       Urethral Dilation  Urethral dilation is a procedure to stretch open (dilate) the urethra. The urethra is the tube that drains urine from the bladder out of the body. In women, the urethra opens above the vaginal opening. In men, the urethra opens at the tip of the penis.  Urethral dilation is usually done to treat narrowing of the urethra (urethral stricture), which can make it difficult to pass urine. Urethral dilation widens the urethra so that you can pass urine normally. Urethral dilation is done through the urethral opening. There are no incisions made during the procedure. Tell a health care provider about:  Any allergies you have.  All medicines you are taking, including vitamins, herbs, eye drops, creams, and over-the-counter medicines.  Any problems you or family members have had with anesthetic medicines.  Any blood disorders you have.  Any surgeries you have had.  Any medical conditions you have.  Whether you are pregnant or may be pregnant. What are the risks? Generally, this is a safe procedure. However, problems may occur, including:  Bleeding.  Infection.  A return of urethral stricture, which requires repeating the dilation procedure.  Damage to the urethra, which may require reconstructive surgery.  Allergic reactions to medicines. What happens before the procedure? Medicines Ask your health care provider about:  Changing or stopping your regular medicines. This is especially important if you are taking diabetes medicines or blood thinners.  Taking medicines such as aspirin and ibuprofen. These medicines can thin your blood. Do not take these medicines unless your  health care provider tells you to take them.  Taking over-the-counter medicines, vitamins, herbs, and supplements. General instructions  Follow instructions from your health care provider about eating or drinking restrictions.  Plan to have someone take you home from the hospital or clinic.  If you will be going home right after the procedure, plan to have someone with you for 24 hours.  Ask your health care provider what steps will be taken to help prevent infection. These may include: ? Washing skin with a germ-killing soap. ? Taking antibiotic  medicine. What happens during the procedure?  An IV may be inserted into one of your veins.  You will be given one or more of the following medicines: ? A local anesthetic to numb your urethral opening. This will be applied as a gel that will also lubricate the urethral opening. ? A sedative to help you relax.  A thin tube with a light and camera on the end (cystoscope) will be inserted into your urethra.  Your urethra will be rinsed (irrigated) with a germ-free (sterile) water solution.  Narrow parts of your urethra will be stretched open using a dilator tool. Your surgeon will start with a very thin dilator, then use wider dilators as needed.  A thin tube with an inflatable balloon on the tip may be inserted into your urethra. The balloon may be inflated to help stretch your urethra open.  Your urethra will be irrigated. The procedure may vary among health care providers and hospitals. What can I expect after the procedure?  After the procedure, it is common to have: ? Burning pain when urinating. ? Blood in your urine. ? A need to urinate frequently.  You will be asked to urinate before you leave the hospital or clinic.  Your urine flow should improve within a few days. Follow these instructions at home: Medicines  Take over-the-counter and prescription medicines only as told by your health care provider.  If you were prescribed an antibiotic medicine, take it as told by your health care provider. Do not stop taking the antibiotic even if you start to feel better.  Ask your health care provider if the medicine prescribed to you: ? Requires you to avoid driving or using heavy machinery. ? Can cause constipation. You may need to take these actions to prevent or treat constipation:  Take over-the-counter or prescription medicines.  Eat foods that are high in fiber, such as beans, whole grains, and fresh fruits and vegetables.  Limit foods that are high in fat and processed  sugars, such as fried or sweet foods. General instructions  Do not drive for 24 hours if you were given a sedative during your procedure.  If you were sent home with a small, lubricated tube (catheter) to help keep your urethra open, follow your health care provider's instructions about how and when to use it.  Drink enough fluid to keep your urine pale yellow.  Return to your normal activities as told by your health care provider. Ask your health care provider what activities are safe for you.  Keep all follow-up visits as told by your health care provider. This is important. Contact a health care provider if:  Your urine is cloudy and smells bad.  You develop new bleeding when you urinate.  You pass blood clots when you urinate.  You have pain that does not get better with medicine.  You have a fever.  You have swelling, bruising, or discoloration of your genital area. This includes  the penis, scrotum, and inner thighs for men, and the outer genital organs (vulva) and inner thighs for women. Get help right away if:  You develop new bleeding that does not stop.  You cannot pass urine. Summary  Urethral dilation is a procedure to stretch open (dilate) the urethra.  Urethral dilation is usually done to treat narrowing of the urethra (urethral stricture), which can make it difficult to pass urine.  Ask your health care provider about changing or stopping your regular medicines before the procedure.  After the procedure, it is common to have burning pain when urinating, blood in your urine, and a need to urinate frequently. This information is not intended to replace advice given to you by your health care provider. Make sure you discuss any questions you have with your health care provider. Document Revised: 10/19/2018 Document Reviewed: 10/19/2018 Elsevier Patient Education  2021 [place].  Ureteral Stent Implantation, Care After This sheet gives you information about  how to care for yourself after your procedure. Your health care provider may also give you more specific instructions. If you have problems or questions, contact your health care provider. What can I expect after the procedure? After the procedure, it is common to have:  Nausea.  Mild pain when you urinate. You may feel this pain in your lower back or lower abdomen. The pain should stop within a few minutes after you urinate. This may last for up to 1 week.  A small amount of blood in your urine for several days. Follow these instructions at home: Medicines  Take over-the-counter and prescription medicines only as told by your health care provider.  If you were prescribed an antibiotic medicine, take it as told by your health care provider. Do not stop taking the antibiotic even if you start to feel better.  Do not drive for 24 hours if you were given a sedative during your procedure.  Ask your health care provider if the medicine prescribed to you requires you to avoid driving or using heavy machinery. Activity  Rest as told by your health care provider.  Avoid sitting for a long time without moving. Get up to take short walks every 1-2 hours. This is important to improve blood flow and breathing. Ask for help if you feel weak or unsteady.  Return to your normal activities as told by your health care provider. Ask your health care provider what activities are safe for you. General instructions  Watch for any blood in your urine. Call your health care provider if the amount of blood in your urine increases.  If you have a catheter: ? Follow instructions from your health care provider about taking care of your catheter and collection bag. ? Do not take baths, swim, or use a hot tub until your health care provider approves. Ask your health care provider if you may take showers. You may only be allowed to take sponge baths.  Drink enough fluid to keep your urine pale yellow.  Do not  use any products that contain nicotine or tobacco, such as cigarettes, e-cigarettes, and chewing tobacco. These can delay healing after surgery. If you need help quitting, ask your health care provider.  Keep all follow-up visits as told by your health care provider. This is important.   Contact a health care provider if:  You have pain that gets worse or does not get better with medicine, especially pain when you urinate.  You have difficulty urinating.  You feel nauseous or  you vomit repeatedly during a period of more than 2 days after the procedure. Get help right away if:  Your urine is dark red or has blood clots in it.  You are leaking urine (have incontinence).  The end of the stent comes out of your urethra.  You cannot urinate.  You have sudden, sharp, or severe pain in your abdomen or lower back.  You have a fever.  You have swelling or pain in your legs.  You have difficulty breathing. Summary  After the procedure, it is common to have mild pain when you urinate that goes away within a few minutes after you urinate. This may last for up to 1 week.  Watch for any blood in your urine. Call your health care provider if the amount of blood in your urine increases.  Take over-the-counter and prescription medicines only as told by your health care provider.  Drink enough fluid to keep your urine pale yellow. This information is not intended to replace advice given to you by your health care provider. Make sure you discuss any questions you have with your health care provider. Document Revised: 06/13/2018 Document Reviewed: 06/14/2018 Elsevier Patient Education  2021 [place] Anesthesia, Adult, Care After This sheet gives you information about how to care for yourself after your procedure. Your health care provider may also give you more specific instructions. If you have problems or questions, contact your health care provider. What can I expect after the  procedure? After the procedure, the following side effects are common:  Pain or discomfort at the IV site.  Nausea.  Vomiting.  Sore throat.  Trouble concentrating.  Feeling cold or chills.  Feeling weak or tired.  Sleepiness and fatigue.  Soreness and body aches. These side effects can affect parts of the body that were not involved in surgery. Follow these instructions at home: For the time period you were told by your health care provider:  Rest.  Do not participate in activities where you could fall or become injured.  Do not drive or use machinery.  Do not drink alcohol.  Do not take sleeping pills or medicines that cause drowsiness.  Do not make important decisions or sign legal documents.  Do not take care of children on your own.   Eating and drinking  Follow any instructions from your health care provider about eating or drinking restrictions.  When you feel hungry, start by eating small amounts of foods that are soft and easy to digest (bland), such as toast. Gradually return to your regular diet.  Drink enough fluid to keep your urine pale yellow.  If you vomit, rehydrate by drinking water, juice, or clear broth. General instructions  If you have sleep apnea, surgery and certain medicines can increase your risk for breathing problems. Follow instructions from your health care provider about wearing your sleep device: ? Anytime you are sleeping, including during daytime naps. ? While taking prescription pain medicines, sleeping medicines, or medicines that make you drowsy.  Have a responsible adult stay with you for the time you are told. It is important to have someone help care for you until you are awake and alert.  Return to your normal activities as told by your health care provider. Ask your health care provider what activities are safe for you.  Take over-the-counter and prescription medicines only as told by your health care provider.  If you  smoke, do not smoke without supervision.  Keep all follow-up visits as  told by your health care provider. This is important. Contact a health care provider if:  You have nausea or vomiting that does not get better with medicine.  You cannot eat or drink without vomiting.  You have pain that does not get better with medicine.  You are unable to pass urine.  You develop a skin rash.  You have a fever.  You have redness around your IV site that gets worse. Get help right away if:  You have difficulty breathing.  You have chest pain.  You have blood in your urine or stool, or you vomit blood. Summary  After the procedure, it is common to have a sore throat or nausea. It is also common to feel tired.  Have a responsible adult stay with you for the time you are told. It is important to have someone help care for you until you are awake and alert.  When you feel hungry, start by eating small amounts of foods that are soft and easy to digest (bland), such as toast. Gradually return to your regular diet.  Drink enough fluid to keep your urine pale yellow.  Return to your normal activities as told by your health care provider. Ask your health care provider what activities are safe for you. This information is not intended to replace advice given to you by your health care provider. Make sure you discuss any questions you have with your health care provider. Document Revised: 05/22/2020 Document Reviewed: 12/20/2019 Elsevier Patient Education  2021 [employer].

## 2020-12-17 ENCOUNTER — Other Ambulatory Visit (HOSPITAL_COMMUNITY)
Admission: RE | Admit: 2020-12-17 | Discharge: 2020-12-17 | Disposition: A | Discharge status: Home / Self Care | Payer: Medicare Other | Source: Ambulatory Visit | Attending: Urology | Admitting: Urology

## 2020-12-17 ENCOUNTER — Other Ambulatory Visit: Payer: Self-pay

## 2020-12-17 ENCOUNTER — Encounter (HOSPITAL_COMMUNITY)
Admission: RE | Admit: 2020-12-17 | Discharge: 2020-12-17 | Disposition: A | Discharge status: Home / Self Care | Payer: Medicare Other | Source: Ambulatory Visit | Attending: Urology | Admitting: Urology

## 2020-12-17 ENCOUNTER — Encounter (HOSPITAL_COMMUNITY): Payer: Self-pay

## 2020-12-17 DIAGNOSIS — Z01818 Encounter for other preprocedural examination: Secondary | ICD-10-CM | POA: Diagnosis not present

## 2020-12-17 DIAGNOSIS — Z20822 Contact with and (suspected) exposure to covid-19: Secondary | ICD-10-CM | POA: Diagnosis not present

## 2020-12-17 LAB — BASIC METABOLIC PANEL
Anion gap: 10 (ref 5–15)
BUN: 25 mg/dL — ABNORMAL HIGH (ref 8–23)
CO2: 23 mmol/L (ref 22–32)
Calcium: 9.8 mg/dL (ref 8.9–10.3)
Chloride: 103 mmol/L (ref 98–111)
Creatinine, Ser: 1.51 mg/dL — ABNORMAL HIGH (ref 0.44–1.00)
GFR, Estimated: 36 mL/min — ABNORMAL LOW (ref >60)
Glucose, Bld: 101 mg/dL — ABNORMAL HIGH (ref 70–99)
Potassium: 4.7 mmol/L (ref 3.5–5.1)
Sodium: 136 mmol/L (ref 135–145)

## 2020-12-17 LAB — SARS CORONAVIRUS 2 (TAT 6-24 HRS): SARS Coronavirus 2: NEGATIVE

## 2020-12-19 ENCOUNTER — Other Ambulatory Visit: Payer: Self-pay | Admitting: Student

## 2020-12-19 ENCOUNTER — Ambulatory Visit (HOSPITAL_COMMUNITY): Payer: Medicare Other | Admitting: Anesthesiology

## 2020-12-19 ENCOUNTER — Other Ambulatory Visit: Payer: Self-pay | Admitting: Urology

## 2020-12-19 ENCOUNTER — Encounter (HOSPITAL_COMMUNITY)
Admission: RE | Disposition: A | Discharge status: Home / Self Care | Payer: Self-pay | Source: Ambulatory Visit | Attending: Urology

## 2020-12-19 ENCOUNTER — Ambulatory Visit (HOSPITAL_COMMUNITY): Payer: Medicare Other

## 2020-12-19 ENCOUNTER — Ambulatory Visit (HOSPITAL_COMMUNITY)
Admission: RE | Admit: 2020-12-19 | Discharge: 2020-12-19 | Disposition: A | Discharge status: Home / Self Care | Payer: Medicare Other | Source: Ambulatory Visit | Attending: Urology | Admitting: Urology

## 2020-12-19 DIAGNOSIS — Z881 Allergy status to other antibiotic agents status: Secondary | ICD-10-CM | POA: Insufficient documentation

## 2020-12-19 DIAGNOSIS — N1330 Unspecified hydronephrosis: Secondary | ICD-10-CM | POA: Insufficient documentation

## 2020-12-19 DIAGNOSIS — Z87442 Personal history of urinary calculi: Secondary | ICD-10-CM | POA: Insufficient documentation

## 2020-12-19 DIAGNOSIS — Z0389 Encounter for observation for other suspected diseases and conditions ruled out: Secondary | ICD-10-CM | POA: Diagnosis not present

## 2020-12-19 DIAGNOSIS — N131 Hydronephrosis with ureteral stricture, not elsewhere classified: Secondary | ICD-10-CM

## 2020-12-19 HISTORY — PX: CYSTOSCOPY WITH RETROGRADE PYELOGRAM, URETEROSCOPY AND STENT PLACEMENT: SHX5789

## 2020-12-19 SURGERY — CYSTOURETEROSCOPY, WITH RETROGRADE PYELOGRAM AND STENT INSERTION
Anesthesia: General | Laterality: Right

## 2020-12-19 MED ORDER — ONDANSETRON HCL 4 MG/2ML IJ SOLN: 4.0000 mg | Freq: Once | INTRAMUSCULAR | Status: DC | PRN

## 2020-12-19 MED ORDER — LIDOCAINE HCL (CARDIAC) PF 100 MG/5ML IV SOSY
PREFILLED_SYRINGE | INTRAVENOUS | Status: DC | PRN
  Administered 2020-12-19: 50 mg via INTRAVENOUS

## 2020-12-19 MED ORDER — CEFAZOLIN SODIUM-DEXTROSE 2-4 GM/100ML-% IV SOLN
2.0000 g | INTRAVENOUS | Status: AC
  Administered 2020-12-19: 2 g via INTRAVENOUS
  Filled 2020-12-19: qty 100

## 2020-12-19 MED ORDER — OXYCODONE-ACETAMINOPHEN 5-325 MG PO TABS: 1.0000 | ORAL_TABLET | ORAL | 0 refills | Status: DC | PRN

## 2020-12-19 MED ORDER — WATER FOR IRRIGATION, STERILE IR SOLN
Status: DC | PRN
  Administered 2020-12-19: 1000 mL

## 2020-12-19 MED ORDER — DIATRIZOATE MEGLUMINE 30 % UR SOLN
URETHRAL | Status: DC | PRN
  Administered 2020-12-19: 20 mL via URETHRAL

## 2020-12-19 MED ORDER — SODIUM CHLORIDE 0.9 % IR SOLN
Status: DC | PRN
  Administered 2020-12-19 (×2): 3000 mL

## 2020-12-19 MED ORDER — LACTATED RINGERS IV SOLN: INTRAVENOUS | Status: DC

## 2020-12-19 MED ORDER — PROPOFOL 10 MG/ML IV BOLUS
INTRAVENOUS | Status: DC | PRN
  Administered 2020-12-19: 150 mg via INTRAVENOUS

## 2020-12-19 MED ORDER — EPHEDRINE SULFATE 50 MG/ML IJ SOLN
INTRAMUSCULAR | Status: DC | PRN
  Administered 2020-12-19: 10 mg via INTRAVENOUS

## 2020-12-19 MED ORDER — FENTANYL CITRATE (PF) 100 MCG/2ML IJ SOLN
25.0000 ug | INTRAMUSCULAR | Status: DC | PRN
Start: 2020-12-19 — End: 2020-12-19

## 2020-12-19 MED ORDER — FENTANYL CITRATE (PF) 100 MCG/2ML IJ SOLN
INTRAMUSCULAR | Status: AC
  Filled 2020-12-19: qty 2

## 2020-12-19 MED ORDER — FENTANYL CITRATE (PF) 100 MCG/2ML IJ SOLN
INTRAMUSCULAR | Status: DC | PRN
  Administered 2020-12-19 (×2): 25 ug via INTRAVENOUS

## 2020-12-19 MED ORDER — ONDANSETRON HCL 4 MG/2ML IJ SOLN
INTRAMUSCULAR | Status: DC | PRN
  Administered 2020-12-19: 4 mg via INTRAVENOUS

## 2020-12-19 MED ORDER — LIDOCAINE HCL (PF) 2 % IJ SOLN
INTRAMUSCULAR | Status: AC
  Filled 2020-12-19: qty 5

## 2020-12-19 MED ORDER — ORAL CARE MOUTH RINSE: 15.0000 mL | Freq: Once | OROMUCOSAL | Status: AC

## 2020-12-19 MED ORDER — PROPOFOL 10 MG/ML IV BOLUS
INTRAVENOUS | Status: AC
  Filled 2020-12-19: qty 40

## 2020-12-19 MED ORDER — CHLORHEXIDINE GLUCONATE 0.12 % MT SOLN
15.0000 mL | Freq: Once | OROMUCOSAL | Status: AC
  Administered 2020-12-19: 15 mL via OROMUCOSAL
  Filled 2020-12-19: qty 15

## 2020-12-19 SURGICAL SUPPLY — 27 items
BAG DRAIN URO TABLE W/ADPT NS (BAG) ×2 IMPLANT
BAG DRN 8 ADPR NS SKTRN CSTL (BAG) ×1
BAG HAMPER (MISCELLANEOUS) ×2 IMPLANT
CATH INTERMIT  6FR 70CM (CATHETERS) ×2 IMPLANT
CLOTH BEACON ORANGE TIMEOUT ST (SAFETY) ×2 IMPLANT
DECANTER SPIKE VIAL GLASS SM (MISCELLANEOUS) ×2 IMPLANT
EXTRACTOR STONE NITINOL NGAGE (UROLOGICAL SUPPLIES) ×2 IMPLANT
GLOVE BIO SURGEON STRL SZ8 (GLOVE) ×2 IMPLANT
GLOVE SS BIOGEL STRL SZ 6.5 (GLOVE) IMPLANT
GLOVE SS BIOGEL STRL SZ 7.5 (GLOVE) IMPLANT
GLOVE SUPERSENSE BIOGEL SZ 6.5 (GLOVE) ×1
GLOVE SUPERSENSE BIOGEL SZ 7.5 (GLOVE) ×1
GLOVE SURG UNDER POLY LF SZ7 (GLOVE) ×4 IMPLANT
GOWN STRL REUS W/TWL LRG LVL3 (GOWN DISPOSABLE) ×2 IMPLANT
GOWN STRL REUS W/TWL XL LVL3 (GOWN DISPOSABLE) ×2 IMPLANT
GUIDEWIRE STR DUAL SENSOR (WIRE) ×2 IMPLANT
GUIDEWIRE STR ZIPWIRE 035X150 (MISCELLANEOUS) ×2 IMPLANT
IV NS IRRIG 3000ML ARTHROMATIC (IV SOLUTION) ×4 IMPLANT
KIT TURNOVER CYSTO (KITS) ×2 IMPLANT
MANIFOLD NEPTUNE II (INSTRUMENTS) ×2 IMPLANT
PACK CYSTO (CUSTOM PROCEDURE TRAY) ×2 IMPLANT
PAD ARMBOARD 7.5X6 YLW CONV (MISCELLANEOUS) ×2 IMPLANT
STENT URET 6FRX26 CONTOUR (STENTS) ×2 IMPLANT
SYR 10ML LL (SYRINGE) ×2 IMPLANT
SYR CONTROL 10ML LL (SYRINGE) ×2 IMPLANT
TOWEL OR 17X26 4PK STRL BLUE (TOWEL DISPOSABLE) ×2 IMPLANT
WATER STERILE IRR 500ML POUR (IV SOLUTION) ×2 IMPLANT

## 2020-12-19 NOTE — Discharge Instructions (Signed)
Hydronephrosis  Hydronephrosis is the swelling of one or both kidneys due to a blockage that stops urine from flowing out of the body. Kidneys filter waste from the blood and produce urine. This condition can lead to kidney failure and may become life-threatening if not treated promptly. What are the causes? In infants and children, common causes include problems that occur when a baby is developing in the womb. These can include problems in the kidneys or in the tubes that drain urine into the bladder (ureters). In adults, common causes include:  Kidney stones.  Pregnancy.  A tumor or cyst in the abdomen or pelvis.  An enlarged prostate gland. Other causes include:  Bladder infection.  Scar tissue from a previous surgery or injury.  A blood clot.  Cancer of the prostate, bladder, uterus, ovary, or colon. What are the signs or symptoms? Symptoms of this condition include:  Pain or discomfort in your side (flank) or abdomen.  Swelling in your abdomen.  Nausea and vomiting.  Fever.  Pain when passing urine.  Feelings of urgency when you need to urinate.  Urinating more often than normal. In some cases, you may not have any symptoms. How is this diagnosed? This condition may be diagnosed based on:  Your symptoms and medical history.  A physical exam.  Blood and urine tests.  Imaging tests, such as an ultrasound, CT scan, or MRI.  A procedure to look at your urinary tract and bladder by inserting a scope into the urethra (cystoscopy). How is this treated? Treatment for this condition depends on where the blockage is, how long it has been there, and what caused it. The goal of treatment is to remove the blockage. Treatment may include:  Antibiotic medicines to treat or prevent infection.  A procedure to place a small, thin tube (stent) into a blocked ureter. The stent will keep the ureter open so that urine can drain through it.  A nonsurgical procedure that  crushes kidney stones with shock waves (extracorporeal shock wave lithotripsy).  If kidney failure occurs, treatment may include dialysis or a kidney transplant. Follow these instructions at home:  Take over-the-counter and prescription medicines only as told by your health care provider.  If you were prescribed an antibiotic medicine, take it exactly as told by your health care provider. Do not stop taking the antibiotic even if you start to feel better.  Rest and return to your normal activities as told by your health care provider. Ask your health care provider what activities are safe for you.  Drink enough fluid to keep your urine pale yellow.  Keep all follow-up visits. This is important.   Contact a health care provider if:  You continue to have symptoms after treatment.  You develop new symptoms.  Your urine becomes cloudy or bloody.  You have a fever. Get help right away if:  You have severe flank or abdominal pain.  You cannot drink fluids without vomiting. Summary  Hydronephrosis is the swelling of one or both kidneys due to a blockage that stops urine from flowing out of the body.  Hydronephrosis can lead to kidney failure and may become life-threatening if not treated promptly.  The goal of treatment is to remove the blockage. It may include a procedure to insert a stent into a blocked ureter, a procedure to break up kidney stones, or taking antibiotic medicines.  Follow your health care provider's instructions for taking care of yourself at home, including instructions about drinking fluids, taking   medicines, and limiting activities. This information is not intended to replace advice given to you by your health care provider. Make sure you discuss any questions you have with your health care provider. Document Revised: 12/25/2019 Document Reviewed: 12/25/2019 Elsevier Patient Education  2021 [place] Anesthesia, Adult, Care After This  sheet gives you information about how to care for yourself after your procedure. Your health care provider may also give you more specific instructions. If you have problems or questions, contact your health care provider. What can I expect after the procedure? After the procedure, the following side effects are common:  Pain or discomfort at the IV site.  Nausea.  Vomiting.  Sore throat.  Trouble concentrating.  Feeling cold or chills.  Feeling weak or tired.  Sleepiness and fatigue.  Soreness and body aches. These side effects can affect parts of the body that were not involved in surgery. Follow these instructions at home: For the time period you were told by your health care provider:  Rest.  Do not participate in activities where you could fall or become injured.  Do not drive or use machinery.  Do not drink alcohol.  Do not take sleeping pills or medicines that cause drowsiness.  Do not make important decisions or sign legal documents.  Do not take care of children on your own.   Eating and drinking  Follow any instructions from your health care provider about eating or drinking restrictions.  When you feel hungry, start by eating small amounts of foods that are soft and easy to digest (bland), such as toast. Gradually return to your regular diet.  Drink enough fluid to keep your urine pale yellow.  If you vomit, rehydrate by drinking water, juice, or clear broth. General instructions  If you have sleep apnea, surgery and certain medicines can increase your risk for breathing problems. Follow instructions from your health care provider about wearing your sleep device: ? Anytime you are sleeping, including during daytime naps. ? While taking prescription pain medicines, sleeping medicines, or medicines that make you drowsy.  Have a responsible adult stay with you for the time you are told. It is important to have someone help care for you until you are awake  and alert.  Return to your normal activities as told by your health care provider. Ask your health care provider what activities are safe for you.  Take over-the-counter and prescription medicines only as told by your health care provider.  If you smoke, do not smoke without supervision.  Keep all follow-up visits as told by your health care provider. This is important. Contact a health care provider if:  You have nausea or vomiting that does not get better with medicine.  You cannot eat or drink without vomiting.  You have pain that does not get better with medicine.  You are unable to pass urine.  You develop a skin rash.  You have a fever.  You have redness around your IV site that gets worse. Get help right away if:  You have difficulty breathing.  You have chest pain.  You have blood in your urine or stool, or you vomit blood. Summary  After the procedure, it is common to have a sore throat or nausea. It is also common to feel tired.  Have a responsible adult stay with you for the time you are told. It is important to have someone help care for you until you are awake and alert.  When you feel hungry, start by eating small amounts of foods that are soft and easy to digest (bland), such as toast. Gradually return to your regular diet.  Drink enough fluid to keep your urine pale yellow.  Return to your normal activities as told by your health care provider. Ask your health care provider what activities are safe for you. This information is not intended to replace advice given to you by your health care provider. Make sure you discuss any questions you have with your health care provider. Document Revised: 05/22/2020 Document Reviewed: 12/20/2019 Elsevier Patient Education  2021 [employer].

## 2020-12-19 NOTE — Progress Notes (Signed)
Prior to discharge patient requesting to speak to Dr. Alyson Ingles. Dr. Alyson Ingles spoke with patient on phone.

## 2020-12-19 NOTE — Anesthesia Postprocedure Evaluation (Signed)
Anesthesia Post Note  Patient: Amanda Case  Procedure(s) Performed: CYSTOSCOPY WITH RETROGRADE PYELOGRAM, URETEROSCOPY DIAGNOSTIC (Right )  Patient location during evaluation: PACU Anesthesia Type: General Level of consciousness: awake and alert and oriented Respiratory status: spontaneous breathing and respiratory function stable Cardiovascular status: blood pressure returned to baseline and stable Postop Assessment: no apparent nausea or vomiting Anesthetic complications: no   No complications documented.   Last Vitals:  Vitals:   12/19/20 0900 12/19/20 0919  BP: (!) 141/58 (!) 167/73  Pulse: 86 82  Resp: 12 18  Temp:  36.6 C  SpO2: 97% 98%    Last Pain:  Vitals:   12/19/20 0919  TempSrc: Oral  PainSc: 0-No pain                 [name] C Ray Glacken

## 2020-12-19 NOTE — Interval H&P Note (Signed)
History and Physical Interval Note:  12/19/2020 7:45 AM  Amanda Case  has presented today for surgery, with the diagnosis of right hydronephrosis.  The various methods of treatment have been discussed with the patient and family. After consideration of risks, benefits and other options for treatment, the patient has consented to  Procedure(s): CYSTOSCOPY WITH RETROGRADE PYELOGRAM, URETEROSCOPY AND STENT PLACEMENT- DIAGNOSTIC (Right) BALLOON DILATION (Right) as a surgical intervention.  The patient's history has been reviewed, patient examined, no change in status, stable for surgery.  I have reviewed the patient's chart and labs.  Questions were answered to the patient's satisfaction.     Nicolette Bang

## 2020-12-19 NOTE — Anesthesia Procedure Notes (Signed)
Procedure Name: LMA Insertion Date/Time: 12/19/2020 7:56 AM Performed by: Gayland Curry, CRNA Pre-anesthesia Checklist: Patient identified, Emergency Drugs available, Suction available and Patient being monitored Patient Re-evaluated:Patient Re-evaluated prior to induction Oxygen Delivery Method: Circle system utilized Preoxygenation: Pre-oxygenation with 100% oxygen Induction Type: IV induction Ventilation: Mask ventilation without difficulty LMA: LMA inserted LMA Size: 3.0 Number of attempts: 1 Placement Confirmation: positive ETCO2 and breath sounds checked- equal and bilateral Tube secured with: Tape Dental Injury: Teeth and Oropharynx as per pre-operative assessment

## 2020-12-19 NOTE — Op Note (Signed)
Preoperative diagnosis: Right hydronephrosis  Postoperative diagnosis: Same  Procedure: 1 cystoscopy 2.  right retrograde pyelography 3.  Intraoperative fluoroscopy, under one hour, with interpretation 4.  Right diagnostic ureteroscopy  Attending: Rosie Fate  Anesthesia: General  Estimated blood loss: None  Drains: none  Specimens: none  Antibiotics: ancef  Findings: obliteration of the right ureter at the level of the mid ureter. No identifiable ureteral lumen past the mid ureter on ureteroscopy.  Indications: Patient is a 75 year old female with a history of hydronephrosis after hysterectomy.  After discussing treatment options, she decided proceed with right diagnostic ureteroscopy  Procedure her in detail: The patient was brought to the operating room and a brief timeout was done to ensure correct patient, correct procedure, correct site.  General anesthesia was administered patient was placed in dorsal lithotomy position.  Her genitalia was then prepped and draped in usual sterile fashion.  A rigid 14 French cystoscope was passed in the urethra and the bladder.  Bladder was inspected free masses or lesions.  the right ureteral orifices were in the normal orthotopic locations.  a 6 french ureteral catheter was then instilled into the right ureter orifice.  a gentle retrograde was obtained and findings noted above.  We attempted to advance a zip wire through the ureteral catheter past the mid ureter which was unsuccessful.  we then removed the cystoscope and cannulated the right ureteral orifice with a semirigid ureteroscope.  we then performed ureteroscopy up to the level of the mid ureter. We encountered a blind ending mid ureter and were unable to pass a wire any further. We then elected to terminate the procedure.  the bladder was then drained and this concluded the procedure which was well tolerated by patient.  Complications: None  Condition: Stable, extubated,  transferred to PACU  Plan: The patient will be discharged home and will be scheduled for outpatient right nephrostomy tube placement.

## 2020-12-19 NOTE — Anesthesia Preprocedure Evaluation (Signed)
Anesthesia Evaluation  Patient identified by MRN, date of birth, ID band Patient awake    Reviewed: Allergy & Precautions, NPO status , Patient's Chart, lab work & pertinent test results  Airway Mallampati: II  TM Distance: >3 FB Neck ROM: Full    Dental  (+) Dental Advisory Given, Teeth Intact   Pulmonary neg pulmonary ROS,    Pulmonary exam normal breath sounds clear to auscultation       Cardiovascular Exercise Tolerance: Good hypertension, Pt. on medications Normal cardiovascular exam Rhythm:Regular Rate:Normal     Neuro/Psych negative neurological ROS  negative psych ROS   GI/Hepatic negative GI ROS, Neg liver ROS,   Endo/Other  negative endocrine ROS  Renal/GU Renal disease  negative genitourinary   Musculoskeletal   Abdominal   Peds negative pediatric ROS (+)  Hematology negative hematology ROS (+)   Anesthesia Other Findings   Reproductive/Obstetrics                            Anesthesia Physical Anesthesia Plan  ASA: II  Anesthesia Plan: General   Post-op Pain Management:    Induction: Intravenous  PONV Risk Score and Plan: Ondansetron and Dexamethasone  Airway Management Planned: LMA  Additional Equipment:   Intra-op Plan:   Post-operative Plan: Extubation in OR  Informed Consent: I have reviewed the patients History and Physical, chart, labs and discussed the procedure including the risks, benefits and alternatives for the proposed anesthesia with the patient or authorized representative who has indicated his/her understanding and acceptance.     Dental advisory given  Plan Discussed with: CRNA and Surgeon  Anesthesia Plan Comments:         Anesthesia Quick Evaluation

## 2020-12-19 NOTE — Transfer of Care (Signed)
Immediate Anesthesia Transfer of Care Note  Patient: Amanda Case  Procedure(s) Performed: CYSTOSCOPY WITH RETROGRADE PYELOGRAM, URETEROSCOPY DIAGNOSTIC (Right )  Patient Location: PACU  Anesthesia Type:General  Level of Consciousness: awake, alert  and oriented  Airway & Oxygen Therapy: Patient Spontanous Breathing and Patient connected to nasal cannula oxygen  Post-op Assessment: Report given to RN  Post vital signs: Reviewed and stable  Last Vitals:  Vitals Value Taken Time  BP 159/58 12/19/20 0832  Temp    Pulse 92 12/19/20 0833  Resp 13 12/19/20 0833  SpO2 97 % 12/19/20 0833  Vitals shown include unvalidated device data.  Last Pain:  Vitals:   12/19/20 0636  PainSc: 0-No pain         Complications: No complications documented.

## 2020-12-22 ENCOUNTER — Telehealth (HOSPITAL_COMMUNITY): Payer: Self-pay

## 2020-12-22 ENCOUNTER — Inpatient Hospital Stay (HOSPITAL_COMMUNITY): Admit: 2020-12-22 | Payer: Medicare Other

## 2020-12-22 ENCOUNTER — Other Ambulatory Visit: Payer: Self-pay | Admitting: Student

## 2020-12-22 NOTE — Telephone Encounter (Signed)
Called to give pt instructions for nephrostomy placement, no answer, left vm. AW

## 2020-12-23 ENCOUNTER — Ambulatory Visit (HOSPITAL_COMMUNITY)
Admission: RE | Admit: 2020-12-23 | Discharge: 2020-12-23 | Disposition: A | Discharge status: Home / Self Care | Payer: Medicare Other | Source: Ambulatory Visit | Attending: Urology | Admitting: Urology

## 2020-12-23 ENCOUNTER — Encounter (HOSPITAL_COMMUNITY): Payer: Self-pay | Admitting: Urology

## 2020-12-23 ENCOUNTER — Other Ambulatory Visit: Payer: Self-pay

## 2020-12-23 DIAGNOSIS — N131 Hydronephrosis with ureteral stricture, not elsewhere classified: Secondary | ICD-10-CM | POA: Diagnosis not present

## 2020-12-23 DIAGNOSIS — E7800 Pure hypercholesterolemia, unspecified: Secondary | ICD-10-CM | POA: Insufficient documentation

## 2020-12-23 DIAGNOSIS — I10 Essential (primary) hypertension: Secondary | ICD-10-CM | POA: Diagnosis not present

## 2020-12-23 DIAGNOSIS — Z79899 Other long term (current) drug therapy: Secondary | ICD-10-CM | POA: Insufficient documentation

## 2020-12-23 DIAGNOSIS — Z881 Allergy status to other antibiotic agents status: Secondary | ICD-10-CM | POA: Diagnosis not present

## 2020-12-23 DIAGNOSIS — Z436 Encounter for attention to other artificial openings of urinary tract: Secondary | ICD-10-CM | POA: Diagnosis not present

## 2020-12-23 DIAGNOSIS — Z7982 Long term (current) use of aspirin: Secondary | ICD-10-CM | POA: Diagnosis not present

## 2020-12-23 HISTORY — PX: IR NEPHROSTOMY PLACEMENT RIGHT: IMG6064

## 2020-12-23 LAB — CBC
HCT: 39.9 % (ref 36.0–46.0)
Hemoglobin: 13.2 g/dL (ref 12.0–15.0)
MCH: 30 pg (ref 26.0–34.0)
MCHC: 33.1 g/dL (ref 30.0–36.0)
MCV: 90.7 fL (ref 80.0–100.0)
Platelets: 290 10*3/uL (ref 150–400)
RBC: 4.4 MIL/uL (ref 3.87–5.11)
RDW: 13.1 % (ref 11.5–15.5)
WBC: 8.2 10*3/uL (ref 4.0–10.5)
nRBC: 0 % (ref 0.0–0.2)

## 2020-12-23 MED ORDER — LIDOCAINE HCL 1 % IJ SOLN
INTRAMUSCULAR | Status: AC
  Filled 2020-12-23: qty 20

## 2020-12-23 MED ORDER — SODIUM CHLORIDE 0.9 % IV SOLN: INTRAVENOUS | Status: DC

## 2020-12-23 MED ORDER — HYDRALAZINE HCL 20 MG/ML IJ SOLN
INTRAMUSCULAR | Status: AC
  Filled 2020-12-23: qty 1

## 2020-12-23 MED ORDER — MIDAZOLAM HCL 2 MG/2ML IJ SOLN
INTRAMUSCULAR | Status: AC | PRN
  Administered 2020-12-23: 0.5 mg via INTRAVENOUS
  Administered 2020-12-23: 1 mg via INTRAVENOUS

## 2020-12-23 MED ORDER — HYDRALAZINE HCL 20 MG/ML IJ SOLN
INTRAMUSCULAR | Status: AC | PRN
  Administered 2020-12-23: 10 mg via INTRAVENOUS

## 2020-12-23 MED ORDER — SODIUM CHLORIDE 0.9 % IV SOLN
1.0000 g | Freq: Once | INTRAVENOUS | Status: AC
  Administered 2020-12-23: 1 g via INTRAVENOUS
  Filled 2020-12-23: qty 1

## 2020-12-23 MED ORDER — IOHEXOL 300 MG/ML  SOLN
50.0000 mL | Freq: Once | INTRAMUSCULAR | Status: AC | PRN
  Administered 2020-12-23: 10 mL

## 2020-12-23 MED ORDER — FENTANYL CITRATE (PF) 100 MCG/2ML IJ SOLN
INTRAMUSCULAR | Status: AC
  Filled 2020-12-23: qty 2

## 2020-12-23 MED ORDER — MIDAZOLAM HCL 2 MG/2ML IJ SOLN
INTRAMUSCULAR | Status: AC
  Filled 2020-12-23: qty 2

## 2020-12-23 MED ORDER — FENTANYL CITRATE (PF) 100 MCG/2ML IJ SOLN
INTRAMUSCULAR | Status: AC | PRN
  Administered 2020-12-23 (×2): 25 ug via INTRAVENOUS

## 2020-12-23 MED ORDER — LIDOCAINE HCL 1 % IJ SOLN
INTRAMUSCULAR | Status: AC | PRN
  Administered 2020-12-23: 10 mL

## 2020-12-23 NOTE — Consult Note (Signed)
Chief Complaint: Right Sided Hydronephrosis. Request is for right sided nephrostomy tube placement.   Referring Physician(s): McKenzie,Patrick L  Supervising Physician: Corrie Mckusick  Patient Status: [facility] - Out-pt  History of Present Illness: Amanda Case is a 75 y.o. female 75 y.o. female outpatient. History of HTN, HLD, chronic cystitis, kidney stones,  Total abdominal hysterectomy on 9.15.21 with right sided hydronephrosis and nephrolithiasis since procedure. CT renal stone obtained for intermittent right flank pain on 3.25.22 reads There is re demonstrated severe right hydronephrosis and hydroureter to a level just proximal to the ureterovesical junction, with a knuckled, tethered appearance of the most distal right ureter adjacent to the hysterectomy bed. This appearance is essentially unchanged compared to CT dated 08/29/2020. Surgical nets from pyelography performed on 4.1.22 reads performed ureteroscopy up to the level of the mid ureter. We encountered a blind ending mid ureter and were unable to pass a wire any further. We then elected to terminate the procedure. Patient presents for right sided nephrostomy tube placement.   Currently without any significant complaints. Patient alert and laying in bed, calm and comfortable. Denies any fevers, headache, chest pain, SOB, cough, abdominal pain, nausea, vomiting or bleeding. Return precautions and treatment recommendations and follow-up discussed with the patient who is agreeable with the plan.   Past Medical History:  Diagnosis Date  . Hypercholesterolemia   . Hypertension   . Kidney stone     Past Surgical History:  Procedure Laterality Date  . ABDOMINAL HYSTERECTOMY    . CYSTOSCOPY WITH RETROGRADE PYELOGRAM, URETEROSCOPY AND STENT PLACEMENT Right 12/19/2020   Procedure: CYSTOSCOPY WITH RETROGRADE PYELOGRAM, URETEROSCOPY DIAGNOSTIC;  Surgeon: Cleon Gustin, MD;  Location: [facility];  Service: Urology;  Laterality: Right;     Allergies: Cipro [ciprofloxacin-ciproflox hcl er]  Medications: Prior to Admission medications   Medication Sig Start Date End Date Taking? Authorizing Provider  aspirin EC 81 MG tablet Take 81 mg by mouth daily. Swallow whole.   Yes [provider]  hydrochlorothiazide (MICROZIDE) 12.5 MG capsule Take 12.5 mg by mouth daily.   Yes [provider]  losartan (COZAAR) 100 MG tablet Take 100 mg by mouth daily.   Yes [provider]  rosuvastatin (CRESTOR) 10 MG tablet Take 10 mg by mouth at bedtime.   Yes [provider]  vitamin B-12 (CYANOCOBALAMIN) 500 MCG tablet Take 500 mcg by mouth daily.   Yes [provider]  VITAMIN D PO Take 1 capsule by mouth daily.   Yes [provider]  oxyCODONE-acetaminophen (PERCOCET) 5-325 MG tablet Take 1 tablet by mouth every 4 (four) hours as needed. Patient taking differently: Take 1 tablet by mouth every 4 (four) hours as needed for moderate pain. 12/19/20 12/19/21  McKenzie, Candee Furbish, MD     No family history on file.  Social History   Socioeconomic History  . Marital status: Married    Spouse name: Not on file  . Number of children: 2  . Years of education: Not on file  . Highest education level: Not on file  Occupational History  . Occupation: retired  Tobacco Use  . Smoking status: Never Smoker  . Smokeless tobacco: Never Used  Substance and Sexual Activity  . Alcohol use: No  . Drug use: No  . Sexual activity: Not on file  Other Topics Concern  . Not on file  Social History Narrative  . Not on file   Social Determinants of Health   Financial Resource Strain: Not on file  Food Insecurity: Not on file  Transportation Needs: Not on file  Physical Activity: Not on file  Stress: Not on file  Social Connections: Not on file     Review of Systems: A 12 point ROS discussed and pertinent positives are indicated in the HPI above.  All other systems are negative.  Review of  Systems  Constitutional: Negative for fatigue and fever.  HENT: Negative for congestion.   Respiratory: Negative for cough and shortness of breath.   Gastrointestinal: Negative for abdominal pain, diarrhea, nausea and vomiting.    Vital Signs: There were no vitals taken for this visit.  Physical Exam Vitals and nursing note reviewed.  Constitutional:      Appearance: She is well-developed.  HENT:     Head: Normocephalic and atraumatic.  Eyes:     Conjunctiva/sclera: Conjunctivae normal.  Cardiovascular:     Rate and Rhythm: Normal rate and regular rhythm.     Heart sounds: Normal heart sounds.  Pulmonary:     Effort: Pulmonary effort is normal.     Breath sounds: Normal breath sounds.  Musculoskeletal:        General: Normal range of motion.     Cervical back: Normal range of motion.  Skin:    General: Skin is warm.  Neurological:     Mental Status: She is alert and oriented to person, place, and time.     Imaging: DG Retrograde Pyelogram  Result Date: 12/19/2020 CLINICAL DATA:  75 year old female with history of nephrolithiasis and hydronephrosis. EXAM: RETROGRADE PYELOGRAM COMPARISON:  12/12/2020 FLUOROSCOPY TIME:  Fluoroscopy Time:  27 seconds Radiation Exposure Index (if provided by the fluoroscopic device): 8.5 mGy Number of Acquired Spot Images: 4 FINDINGS: Retrograde cannulation of the distal right ureter via transurethral approach. Limited contrast injection performed, inadequate for radiographic diagnosis. IMPRESSION: Fluoroscopic images from retrograde cannulation of right distal ureter. Please refer to the operative note for further details. Electronically Signed   By: Ruthann Cancer MD   On: 12/19/2020 08:37   CT RENAL STONE STUDY  Result Date: 12/12/2020 CLINICAL DATA:  Hydronephrosis, intermittent right flank pain, chronic cystitis, history of kidney stones EXAM: CT ABDOMEN AND PELVIS WITHOUT CONTRAST TECHNIQUE: Multidetector CT imaging of the abdomen and pelvis  was performed following the standard protocol without IV contrast. COMPARISON:  08/29/2020 FINDINGS: Lower chest: No acute abnormality. Hepatobiliary: No solid liver abnormality is seen. No gallstones, gallbladder wall thickening, or biliary dilatation. Pancreas: Unremarkable. No pancreatic ductal dilatation or surrounding inflammatory changes. Spleen: Normal in size without significant abnormality. Adrenals/Urinary Tract: Adrenal glands are unremarkable. There is redemonstrated severe right hydronephrosis and hydroureter to a level just proximal to the ureterovesicular junction, with a knuckled, tethered appearance of the most distal right ureter adjacent to the hysterectomy bed (series 2, image 68). No evidence of urinary tract calculus. Bladder is unremarkable. Stomach/Bowel: Stomach is within normal limits. Appendix appears normal. No evidence of bowel wall thickening, distention, or inflammatory changes. Sigmoid diverticulosis. Vascular/Lymphatic: Aortic atherosclerosis. No enlarged abdominal or pelvic lymph nodes. Reproductive: No mass or other significant abnormality. Other: No abdominal wall hernia or abnormality. No abdominopelvic ascites. A percutaneous pigtail drainage catheter previously positioned in the right hemipelvis has been removed. Musculoskeletal: No acute or significant osseous findings. IMPRESSION: 1. There is redemonstrated severe right hydronephrosis and hydroureter to a level just proximal to the ureterovesicular junction, with a knuckled, tethered appearance of the most distal right ureter adjacent to the hysterectomy bed. This appearance is essentially unchanged compared to CT dated  08/29/2020. 2. No evidence of urinary tract calculus. No left-sided hydronephrosis. 3. A percutaneous pigtail drainage catheter previously positioned in the right hemipelvis has been removed. Electronically Signed   By: Eddie Candle M.D.   On: 12/12/2020 13:05   US THYROID  Result Date: 12/12/2020 CLINICAL  DATA:  Prior ultrasound follow-up. EXAM: THYROID ULTRASOUND TECHNIQUE: Ultrasound examination of the thyroid gland and adjacent soft tissues was performed. COMPARISON:  06/02/2017, 06/13/2018 FINDINGS: Parenchymal Echotexture: Markedly heterogenous Isthmus: 1.2 cm, previously 1.3 cm Right lobe: 5.0 x 2.1 x 2.4 cm, previously 4.9 x 1.8 x 2.4 cm Left lobe: 6.4 x 3.3 x 4.4 cm, previously 5.6 x 3.5 x 4.0 cm _________________________________________________________ Estimated total number of nodules >/= 1 cm: 4 Number of spongiform nodules >/=  2 cm not described below (TR1): 0 Number of mixed cystic and solid nodules >/= 1.5 cm not described below ([place]): 0 _________________________________________________________ Unchanged appearance of previously biopsied right mid thyroid nodule (labeled 1, 2.2 cm, previously 2.4 cm). Interval decreased size of previously visualized right mid, lateral spongiform thyroid nodule (labeled 2, 1.0 cm, previously 1.6 cm). Nodule # 3: Prior biopsy: No Location: Right; Mid Maximum size: 1.0 cm; Other 2 dimensions: 0.7 x 0.6 cm, previously, 1.1 x 0.8 x 0.7 cm Composition: spongiform (0) Echogenicity: hypoechoic (2) Shape: not taller-than-wide (0) Margins: smooth (0) Echogenic foci: none (0) ACR TI-RADS total points: 2. ACR TI-RADS risk category:  TR2 (2 points). Significant change in size (>/= 20% in two dimensions and minimal increase of 2 mm): No Change in features: No Change in ACR TI-RADS risk category: No ACR TI-RADS recommendations: This nodule does NOT meet TI-RADS criteria for biopsy or dedicated follow-up. _________________________________________________________ Unchanged spongiform nodule in the right inferior thyroid (labeled 4, 0.8 cm, previously 0.9 cm). Unchanged solid thyroid nodule in the left mid lobe (labeled 5, 4.1 cm, previously 3.7 cm). IMPRESSION: 1. Unchanged multinodular goiter. 2. The nodule in the mid right thyroid (labeled 3, 1.0 cm, TI-RADS category 2) demonstrates  spongiform characteristics with multifocal fluid interface echogenicities, likely previously mistaken for punctate echogenic foci/calcifications. This nodule is benign and does not warrant additional ultrasound follow-up or tissue sampling. 3. Unchanged previously biopsied left mid (labeled 5) and right mid (labeled 1) solid thyroid nodules. Recommend correlation with prior biopsy results. The above is in keeping with the ACR TI-RADS recommendations - J Am Coll Radiol 2017;14:587-595. Ruthann Cancer, MD [facility] Radiology Electronically Signed   By: Ruthann Cancer MD   On: 12/12/2020 14:43    Labs:  CBC: No results for input(s): WBC, HGB, HCT, PLT in the last 8760 hours.  COAGS: No results for input(s): INR, APTT in the last 8760 hours.  BMP: Recent Labs    12/17/20 1013  NA 136  K 4.7  CL 103  CO2 23  GLUCOSE 101*  BUN 25*  CALCIUM 9.8  CREATININE 1.51*  GFRNONAA 36*    LIVER FUNCTION TESTS: No results for input(s): BILITOT, AST, ALT, ALKPHOS, PROT, ALBUMIN in the last 8760 hours.  Assessment and Plan:  75 y.o. female outpatient. History of HTN, HLD, chronic cystitis, kidney stones, hydronephrosis and nephrolithiasis. CT renal stone obtained for intermittent right flank pain on 3.25.22 reads There is re demonstrated severe right hydronephrosis and hydroureter to a level just proximal to the ureterovesical junction, with a knuckled, tethered appearance of the most distal right ureter adjacent to the hysterectomy bed. This appearance is essentially unchanged compared to CT dated 08/29/2020. Surgical nets  from pyelography performed on 4.1.22 reads performed ureteroscopy up to the level of the mid ureter. We encountered a blind ending mid ureter and were unable to pass a wire any further. We then elected to terminate the procedure. Patient presents for right sided nephrostomy tube placement.  Labs from 3.30 BUN 25, Cr 1.51. Medications  are within acceptable parameters. Allergies include ciprofloxacin.  Patient has been NPO since midnight.  Risks and benefits of right nephrostomy tube placement was discussed with the patient and/or patient's family including, but not limited to bleeding, infection, damage to adjacent structures or low yield requiring additional tests.  All of the questions were answered and there is agreement to proceed.  Consent signed and in chart.   Thank you for this interesting consult.  I greatly enjoyed meeting Amanda Case and look forward to participating in their care.  A copy of this report was sent to the requesting provider on this date.  Electronically Signed: Jacqualine Mau, NP 12/23/2020, 8:52 AM   I spent a total of  30 Minutes   in face to face in clinical consultation, greater than 50% of which was counseling/coordinating care for right nephrostomy tube placement.

## 2020-12-23 NOTE — Discharge Instructions (Signed)
Percutaneous Nephrostomy, Care After This sheet gives you information about how to care for yourself after your procedure. Your health care provider may also give you more specific instructions. If you have problems or questions, contact your health care provider. What can I expect after the procedure? After the procedure, it is common to have:  Some soreness where the nephrostomy tube was inserted (tube insertion site).  Blood-tinged drainage from the nephrostomy tube for the first 24 hours. Follow these instructions at home: Activity  Do not lift anything that is heavier than 10 lb (4.5 kg), or the limit that you are told, until your health care provider says that it is safe.  Return to your normal activities as told by your health care provider. Ask your health care provider what activities are safe for you.  Avoid activities that may cause the nephrostomy tubing to bend.  Do not take baths, swim, or use a hot tub until your health care provider approves. Ask your health care provider if you can take showers. Cover the nephrostomy tube bandage (dressing) with a watertight covering when you take a shower.  If you were given a sedative during the procedure, it can affect you for several hours. Do not drive or operate machinery until your health care provider says that it is safe.   Care of the tube insertion site  Follow instructions from your health care provider about how to take care of your tube insertion site. Make sure you: ? Wash your hands with soap and water for at least 20 seconds before you change your dressing. If soap and water are not available, use hand sanitizer. ? Change your dressing as told by your health care provider. Be careful not to pull on the tube while removing the dressing. ? When you change the dressing, wash the skin around the tube, rinse well, and pat the skin dry.  Check the tube insertion area every day for signs of infection. Check for: ? Redness,  swelling, or pain. ? Fluid or blood. ? Warmth. ? Pus or a bad smell.   Care of the nephrostomy tube and drainage bag  Always keep the tubing, the leg bag, or the bedside drainage bags below the level of the kidney so that your urine drains freely.  When connecting your nephrostomy tube to a drainage bag, make sure that there are no kinks in the tubing and that your urine is draining freely. You may want to use an elastic bandage to wrap any exposed tubing that goes from the nephrostomy tube to any of the connecting tubes.  At night, you may want to connect your nephrostomy tube or the leg bag to a larger bedside drainage bag.  Follow instructions from your health care provider about how to empty or change the drainage bag.  Empty the drainage bag when it becomes ? full.  Replace the drainage bag and any extension tubing that is connected to your nephrostomy tube every 7 days or as told by your health care provider. Your health care provider will explain how to change the drainage bag and extension tubing. General instructions  Take over-the-counter and prescription medicines only as told by your health care provider.  Keep all follow-up visits as told by your health care provider. This is important. ? The nephrostomy tube will need to be changed every 8-12 weeks. Contact a health care provider if:  You have problems with any of the valves or tubing.  You have persistent pain or soreness  in your back.  You have redness, swelling, or pain around your tube insertion site.  You have fluid or blood coming from your tube insertion site.  Your tube insertion site feels warm to the touch.  You have pus or a bad smell coming from your tube insertion site.  You have increased urine output or you feel burning when urinating. Get help right away if:  You have pain in your abdomen during the first week.  You have chest pain or have trouble breathing.  You have a new appearance of blood  in your urine.  You have a fever or chills.  You have back pain that is not relieved by your medicine.  You have decreased urine output.  Your nephrostomy tube comes out. Summary  After the procedure, it is common to have some soreness where the nephrostomy tube was inserted (tube insertion site).  Follow instructions from your health care provider about how to take care of your tube insertion site, nephrostomy tube, and drainage bag.  Keep all follow-up visits for care and for changing the tube. This information is not intended to replace advice given to you by your health care provider. Make sure you discuss any questions you have with your health care provider. Document Revised: 10/02/2019 Document Reviewed: 10/02/2019 Elsevier Patient Education  2021 [place].  Moderate Conscious Sedation, Adult, Care After This sheet gives you information about how to care for yourself after your procedure. Your health care provider may also give you more specific instructions. If you have problems or questions, contact your health care provider. What can I expect after the procedure? After the procedure, it is common to have:  Sleepiness for several hours.  Impaired judgment for several hours.  Difficulty with balance.  Vomiting if you eat too soon. Follow these instructions at home: For the time period you were told by your health care provider:  Rest.  Do not participate in activities where you could fall or become injured.  Do not drive or use machinery.  Do not drink alcohol.  Do not take sleeping pills or medicines that cause drowsiness.  Do not make important decisions or sign legal documents.  Do not take care of children on your own.      Eating and drinking  Follow the diet recommended by your health care provider.  Drink enough fluid to keep your urine pale yellow.  If you vomit: ? Drink water, juice, or soup when you can drink without vomiting. ? Make  sure you have little or no nausea before eating solid foods.   General instructions  Take over-the-counter and prescription medicines only as told by your health care provider.  Have a responsible adult stay with you for the time you are told. It is important to have someone help care for you until you are awake and alert.  Do not smoke.  Keep all follow-up visits as told by your health care provider. This is important. Contact a health care provider if:  You are still sleepy or having trouble with balance after 24 hours.  You feel light-headed.  You keep feeling nauseous or you keep vomiting.  You develop a rash.  You have a fever.  You have redness or swelling around the IV site. Get help right away if:  You have trouble breathing.  You have new-onset confusion at home. Summary  After the procedure, it is common to feel sleepy, have impaired judgment, or feel nauseous if you eat too soon.  Rest after you get home. Know the things you should not do after the procedure.  Follow the diet recommended by your health care provider and drink enough fluid to keep your urine pale yellow.  Get help right away if you have trouble breathing or new-onset confusion at home. This information is not intended to replace advice given to you by your health care provider. Make sure you discuss any questions you have with your health care provider. Document Revised: 01/04/2020 Document Reviewed: 08/02/2019 Elsevier Patient Education  2021 [employer].

## 2020-12-23 NOTE — Procedures (Signed)
Interventional Radiology Procedure Note  Procedure: Image guided right PCN.  80F pigtail drain.  Complications: None  EBL: None Recommendations: - Routine drain care, to gravity - Education in short stay - routine care - DC 1 hr when goals met  Signed,  Dulcy Fanny. Earleen Newport, DO

## 2020-12-26 ENCOUNTER — Encounter: Payer: Self-pay | Admitting: Urology

## 2020-12-26 ENCOUNTER — Ambulatory Visit (INDEPENDENT_AMBULATORY_CARE_PROVIDER_SITE_OTHER): Payer: Medicare Other | Admitting: Urology

## 2020-12-26 ENCOUNTER — Other Ambulatory Visit: Payer: Self-pay

## 2020-12-26 VITALS — BP 150/79 | HR 88 | Temp 98.2°F

## 2020-12-26 DIAGNOSIS — N131 Hydronephrosis with ureteral stricture, not elsewhere classified: Secondary | ICD-10-CM | POA: Diagnosis not present

## 2020-12-26 NOTE — Patient Instructions (Signed)
Hydronephrosis  Hydronephrosis is the swelling of one or both kidneys due to a blockage that stops urine from flowing out of the body. Kidneys filter waste from the blood and produce urine. This condition can lead to kidney failure and may become life-threatening if not treated promptly. What are the causes? In infants and children, common causes include problems that occur when a baby is developing in the womb. These can include problems in the kidneys or in the tubes that drain urine into the bladder (ureters). In adults, common causes include:  Kidney stones.  Pregnancy.  A tumor or cyst in the abdomen or pelvis.  An enlarged prostate gland. Other causes include:  Bladder infection.  Scar tissue from a previous surgery or injury.  A blood clot.  Cancer of the prostate, bladder, uterus, ovary, or colon. What are the signs or symptoms? Symptoms of this condition include:  Pain or discomfort in your side (flank) or abdomen.  Swelling in your abdomen.  Nausea and vomiting.  Fever.  Pain when passing urine.  Feelings of urgency when you need to urinate.  Urinating more often than normal. In some cases, you may not have any symptoms. How is this diagnosed? This condition may be diagnosed based on:  Your symptoms and medical history.  A physical exam.  Blood and urine tests.  Imaging tests, such as an ultrasound, CT scan, or MRI.  A procedure to look at your urinary tract and bladder by inserting a scope into the urethra (cystoscopy). How is this treated? Treatment for this condition depends on where the blockage is, how long it has been there, and what caused it. The goal of treatment is to remove the blockage. Treatment may include:  Antibiotic medicines to treat or prevent infection.  A procedure to place a small, thin tube (stent) into a blocked ureter. The stent will keep the ureter open so that urine can drain through it.  A nonsurgical procedure that  crushes kidney stones with shock waves (extracorporeal shock wave lithotripsy).  If kidney failure occurs, treatment may include dialysis or a kidney transplant. Follow these instructions at home:  Take over-the-counter and prescription medicines only as told by your health care provider.  If you were prescribed an antibiotic medicine, take it exactly as told by your health care provider. Do not stop taking the antibiotic even if you start to feel better.  Rest and return to your normal activities as told by your health care provider. Ask your health care provider what activities are safe for you.  Drink enough fluid to keep your urine pale yellow.  Keep all follow-up visits. This is important.   Contact a health care provider if:  You continue to have symptoms after treatment.  You develop new symptoms.  Your urine becomes cloudy or bloody.  You have a fever. Get help right away if:  You have severe flank or abdominal pain.  You cannot drink fluids without vomiting. Summary  Hydronephrosis is the swelling of one or both kidneys due to a blockage that stops urine from flowing out of the body.  Hydronephrosis can lead to kidney failure and may become life-threatening if not treated promptly.  The goal of treatment is to remove the blockage. It may include a procedure to insert a stent into a blocked ureter, a procedure to break up kidney stones, or taking antibiotic medicines.  Follow your health care provider's instructions for taking care of yourself at home, including instructions about drinking fluids, taking   medicines, and limiting activities. This information is not intended to replace advice given to you by your health care provider. Make sure you discuss any questions you have with your health care provider. Document Revised: 12/25/2019 Document Reviewed: 12/25/2019 Elsevier Patient Education  2021 [employer]  

## 2020-12-26 NOTE — Progress Notes (Signed)
Urological Symptom Review  Patient is experiencing the following symptoms: Frequent urination Get up at night to urinate Injury to kidneys/bladder   Review of Systems  Gastrointestinal (upper)  : Negative for upper GI symptoms  Gastrointestinal (lower) : Negative for lower GI symptoms  Constitutional : Fatigue  Skin: Negative for skin symptoms  Eyes: Negative for eye symptoms  Ear/Nose/Throat : Negative for Ear/Nose/Throat symptoms  Hematologic/Lymphatic: Negative for Hematologic/Lymphatic symptoms  Cardiovascular : Negative for cardiovascular symptoms  Respiratory : Negative for respiratory symptoms  Endocrine: Negative for endocrine symptoms  Musculoskeletal: Negative for musculoskeletal symptoms  Neurological: Negative for neurological symptoms  Psychologic: Negative for psychiatric symptoms

## 2020-12-26 NOTE — Progress Notes (Signed)
12/26/2020 2:18 PM   Amanda Case 1945/12/08 [id number]  Referring provider: Berenice Primas [address] [place],  [place] [address]  followup ureteral stricture  HPI: Amanda Case is a 75yo here for followup after right diagnostic ureteroscopy. 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 She underwent right nephrostomy tube placement 4 days ago. She made 500cc of urine from her right kidney today. Urine is blood tinged. Her back pain is improving.    PMH: Past Medical History:  Diagnosis Date  . Hypercholesterolemia   . Hypertension   . Kidney stone     Surgical History: Past Surgical History:  Procedure Laterality Date  . ABDOMINAL HYSTERECTOMY    . CYSTOSCOPY WITH RETROGRADE PYELOGRAM, URETEROSCOPY AND STENT PLACEMENT Right 12/19/2020   Procedure: CYSTOSCOPY WITH RETROGRADE PYELOGRAM, URETEROSCOPY DIAGNOSTIC;  Surgeon: Cleon Gustin, MD;  Location: [facility];  Service: Urology;  Laterality: Right;  . IR NEPHROSTOMY PLACEMENT RIGHT  12/23/2020    Home Medications:  Allergies as of 12/26/2020      Reactions   Cipro [ciprofloxacin-ciproflox Hcl Er] Hives      Medication List       Accurate as of December 26, 2020  2:18 PM. If you have any questions, ask your nurse or doctor.        aspirin EC 81 MG tablet Take 81 mg by mouth daily. Swallow whole.   hydrochlorothiazide 12.5 MG capsule Commonly known as: MICROZIDE Take 12.5 mg by mouth daily.   losartan 100 MG tablet Commonly known as: COZAAR Take 100 mg by mouth daily.   oxyCODONE-acetaminophen 5-325 MG tablet Commonly known as: Percocet Take 1 tablet by mouth every 4 (four) hours as needed. What changed: reasons to take this   rosuvastatin 10 MG tablet Commonly known as: CRESTOR Take 10 mg by mouth at bedtime.   vitamin B-12 500 MCG tablet Commonly known as: CYANOCOBALAMIN Take 500 mcg by mouth daily.   VITAMIN D PO Take 1 capsule by mouth daily.        Allergies:  Allergies  Allergen Reactions  . Cipro [Ciprofloxacin-Ciproflox Hcl Er] Hives    Family History: No family history on file.  Social History:  reports that she has never smoked. She has never used smokeless tobacco. She reports that she does not drink alcohol and does not use drugs.  ROS: All other review of systems were reviewed and are negative except what is noted above in HPI  Physical Exam: BP (!) 150/79   Pulse 88   Temp 98.2 F (36.8 C)   Constitutional:  Alert and oriented, No acute distress. HEENT: [place] AT, moist mucus membranes.  Trachea midline, no masses. Cardiovascular: No clubbing, cyanosis, or edema. Respiratory: Normal respiratory effort, no increased work of breathing. GI: Abdomen is soft, nontender, nondistended, no abdominal masses GU: No CVA tenderness.  Lymph: No cervical or inguinal lymphadenopathy. Skin: No rashes, bruises or suspicious lesions. Neurologic: Grossly intact, no focal deficits, moving all 4 extremities. Psychiatric: Normal mood and affect.  Laboratory Data: Lab Results  Component Value Date   WBC 8.2 12/23/2020   HGB 13.2 12/23/2020   HCT 39.9 12/23/2020   MCV 90.7 12/23/2020   PLT 290 12/23/2020    Lab Results  Component Value Date   CREATININE 1.51 (H) 12/17/2020    No results found for: PSA  No results found for: TESTOSTERONE  No results found for: HGBA1C  Urinalysis    Component Value  Date/Time   APPEARANCEUR Clear 12/12/2020 1018   GLUCOSEU Negative 12/12/2020 1018   BILIRUBINUR Negative 12/12/2020 1018   PROTEINUR Negative 12/12/2020 1018   UROBILINOGEN negative (A) 01/02/2020 0913   NITRITE Negative 12/12/2020 1018   LEUKOCYTESUR Negative 12/12/2020 1018    Lab Results  Component Value Date   LABMICR See below: 12/12/2020   WBCUA 0-5 12/12/2020   LABEPIT 0-10 12/12/2020   BACTERIA Few 12/12/2020    Pertinent Imaging: IR right nephrostomy 12/23/2020: Images reviewed and discussed with the  patient No results found for this or any previous visit.  No results found for this or any previous visit.  No results found for this or any previous visit.  No results found for this or any previous visit.  No results found for this or any previous visit.  No results found for this or any previous visit.  Results for orders placed during the hospital encounter of 12/06/19  CT HEMATURIA WORKUP  Narrative CLINICAL DATA:  Chronic UTI for 2 years. Hematuria.  EXAM: CT ABDOMEN AND PELVIS WITHOUT AND WITH CONTRAST  TECHNIQUE: Multidetector CT imaging of the abdomen and pelvis was performed following the standard protocol before and following the bolus administration of intravenous contrast.  CONTRAST:  144mL OMNIPAQUE IOHEXOL 300 MG/ML  SOLN  COMPARISON:  No prior images are available for review. Report from 2018 is noted.  FINDINGS: Lower chest: Basilar atelectasis. No effusion.  Hepatobiliary: Mildly lobular hepatic contours. No signs of focal hepatic lesion. Biliary tree is normal. Portal vein is patent.  Pancreas: Pancreas without signs of inflammation, ductal dilation or focal lesion.  Spleen: Spleen is normal size without focal lesion.  Adrenals/Urinary Tract: Adrenal glands are normal.  Renal enhancement is symmetric. No evidence of hydronephrosis. Urinary bladder displaced by large pelvic mass. No suspicious renal lesion.  Excretory phase with limited assessment of the distal ureters. No secondary signs of stricture. The no lesion in the upper tracts.  No urinary tract calculus.  Stomach/Bowel: Small hiatal hernia. No bowel obstruction or acute bowel process. Normal appendix. Sigmoid diverticulosis.  Vascular/Lymphatic: Atheromatous changes in the abdominal aorta. No aneurysm. No adenopathy in the retroperitoneum or in the upper abdomen. No pelvic nodal enlargement. Hazy central small bowel mesentery with scattered small lymph nodes.  Reproductive:  Uterus is not identifiable as a separate structure. There is a large mass in the pelvis with some subtle peripheral calcification. Lesion does not show infiltrative margins and measures approximately 15.1 x 14.7 x 11.2 cm. The uterus appears to be displaced along the left lateral margin of the mass.  The right and left ovary are likely also displaced but not well defined as discrete structures on the current CT study with engorgement of ovarian veins more so on the right than the left.  Other: No ascites.  Musculoskeletal: Spinal degenerative changes without acute or destructive bone finding.  IMPRESSION: 1. 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 There are no infiltrative margins aside from the inability to separate discrete pelvic reproductive structures from this process. Also the absence of adenopathy is reassuring. There is an ultrasound report available from 2007 that also references a mass. Correlate with any surgeries in the interim and with follow-up pelvic imaging with ultrasound or MRI as warranted. 2. No evidence of urinary tract calculus or obstruction. No signs of upper tract lesion to explain hematuria though the mid right ureter and distal right ureter show limited  assessment due to lack of opacification. 3. Mildly lobular hepatic contours may reflect underlying cirrhosis. No focal hepatic lesion. 4. Sigmoid diverticulosis. 5. Small hiatal hernia. 6. Aortic atherosclerosis.  Aortic Atherosclerosis (ICD10-I70.0).   Electronically Signed By: Zetta Bills M.D. On: 12/07/2019 09:09  Results for orders placed in visit on 12/12/20  CT RENAL STONE STUDY  Narrative CLINICAL DATA:  Hydronephrosis, intermittent right flank pain, chronic cystitis, history of kidney stones  EXAM: CT ABDOMEN AND PELVIS WITHOUT CONTRAST  TECHNIQUE: Multidetector CT imaging of the abdomen and pelvis was  performed following the standard protocol without IV contrast.  COMPARISON:  08/29/2020  FINDINGS: Lower chest: No acute abnormality.  Hepatobiliary: No solid liver abnormality is seen. No gallstones, gallbladder wall thickening, or biliary dilatation.  Pancreas: Unremarkable. No pancreatic ductal dilatation or surrounding inflammatory changes.  Spleen: Normal in size without significant abnormality.  Adrenals/Urinary Tract: Adrenal glands are unremarkable. There is redemonstrated severe right hydronephrosis and hydroureter to a level just proximal to the ureterovesicular junction, with a knuckled, tethered appearance of the most distal right ureter adjacent to the hysterectomy bed (series 2, image 68). No evidence of urinary tract calculus. Bladder is unremarkable.  Stomach/Bowel: Stomach is within normal limits. Appendix appears normal. No evidence of bowel wall thickening, distention, or inflammatory changes. Sigmoid diverticulosis.  Vascular/Lymphatic: Aortic atherosclerosis. No enlarged abdominal or pelvic lymph nodes.  Reproductive: No mass or other significant abnormality.  Other: No abdominal wall hernia or abnormality. No abdominopelvic ascites. A percutaneous pigtail drainage catheter previously positioned in the right hemipelvis has been removed.  Musculoskeletal: No acute or significant osseous findings.  IMPRESSION: 1. There is redemonstrated severe right hydronephrosis and hydroureter to a level just proximal to the ureterovesicular junction, with a knuckled, tethered appearance of the most distal right ureter adjacent to the hysterectomy bed. This appearance is essentially unchanged compared to CT dated 08/29/2020.  2. No evidence of urinary tract calculus. No left-sided hydronephrosis.  3. A percutaneous pigtail drainage catheter previously positioned in the right hemipelvis has been removed.   Electronically Signed By: Eddie Candle M.D. On:  12/12/2020 13:05   Assessment & Plan:    1. Hydronephrosis with ureteral stricture, not elsewhere classified -BMP and lasix renogram in 1 month. -RTC 1 month - Urinalysis, Routine w reflex microscopic   No follow-ups on file.  Amanda Bang, MD  [facility] [place]

## 2021-01-01 DIAGNOSIS — D440 Neoplasm of uncertain behavior of thyroid gland: Secondary | ICD-10-CM | POA: Diagnosis not present

## 2021-01-21 ENCOUNTER — Encounter (HOSPITAL_COMMUNITY): Payer: Self-pay

## 2021-01-21 ENCOUNTER — Other Ambulatory Visit: Payer: Self-pay

## 2021-01-21 ENCOUNTER — Encounter (HOSPITAL_COMMUNITY)
Admission: RE | Admit: 2021-01-21 | Discharge: 2021-01-21 | Disposition: A | Discharge status: Home / Self Care | Payer: Medicare Other | Source: Ambulatory Visit | Attending: Urology | Admitting: Urology

## 2021-01-21 ENCOUNTER — Other Ambulatory Visit: Payer: Medicare Other

## 2021-01-21 DIAGNOSIS — N19 Unspecified kidney failure: Secondary | ICD-10-CM | POA: Diagnosis not present

## 2021-01-21 DIAGNOSIS — N131 Hydronephrosis with ureteral stricture, not elsewhere classified: Secondary | ICD-10-CM | POA: Diagnosis not present

## 2021-01-21 DIAGNOSIS — N1330 Unspecified hydronephrosis: Secondary | ICD-10-CM | POA: Diagnosis not present

## 2021-01-21 MED ORDER — FUROSEMIDE 10 MG/ML IJ SOLN: 40.0000 mg | Freq: Once | INTRAMUSCULAR | Status: AC

## 2021-01-21 MED ORDER — FUROSEMIDE 10 MG/ML IJ SOLN
INTRAMUSCULAR | Status: AC
  Administered 2021-01-21: 40 mg via INTRAVENOUS
  Filled 2021-01-21: qty 4

## 2021-01-21 MED ORDER — TECHNETIUM TC 99M MERTIATIDE
5.0000 | Freq: Once | INTRAVENOUS | Status: AC | PRN
  Administered 2021-01-21: 4.6 via INTRAVENOUS

## 2021-01-22 LAB — BASIC METABOLIC PANEL
BUN/Creatinine Ratio: 11 — ABNORMAL LOW (ref 12–28)
BUN: 15 mg/dL (ref 8–27)
CO2: 26 mmol/L (ref 20–29)
Calcium: 10.2 mg/dL (ref 8.7–10.3)
Chloride: 100 mmol/L (ref 96–106)
Creatinine, Ser: 1.32 mg/dL — ABNORMAL HIGH (ref 0.57–1.00)
Glucose: 103 mg/dL — ABNORMAL HIGH (ref 65–99)
Potassium: 4.4 mmol/L (ref 3.5–5.2)
Sodium: 141 mmol/L (ref 134–144)
eGFR: 43 mL/min/{1.73_m2} — ABNORMAL LOW (ref >59)

## 2021-01-28 ENCOUNTER — Other Ambulatory Visit: Payer: Self-pay

## 2021-01-28 ENCOUNTER — Encounter: Payer: Self-pay | Admitting: Urology

## 2021-01-28 ENCOUNTER — Telehealth (INDEPENDENT_AMBULATORY_CARE_PROVIDER_SITE_OTHER): Payer: Medicare Other | Admitting: Urology

## 2021-01-28 DIAGNOSIS — N131 Hydronephrosis with ureteral stricture, not elsewhere classified: Secondary | ICD-10-CM | POA: Diagnosis not present

## 2021-01-28 NOTE — Progress Notes (Signed)
01/28/2021 2:48 PM   Amanda Case [birth year] [id number]  Referring provider: Berenice Primas [address] [place],  [place] [address]   Patient location: home Physician location: office I connected with  Amanda Case on 01/28/21 by a video enabled telemedicine application and verified that I am speaking with the correct person using two identifiers.   I discussed the limitations of evaluation and management by telemedicine. The patient expressed understanding and agreed to proceed.    followup right ureteral stricture  HPI: Amanda Case is a 75yo seen today for right ureteral stricture. She underwent renogram which showed a 26% right renal function. 0.7 minute excretion time. She currently has a right nephrostomy tube in placement.    PMH: Past Medical History:  Diagnosis Date  . Hypercholesterolemia   . Hypertension   . Kidney stone     Surgical History: Past Surgical History:  Procedure Laterality Date  . ABDOMINAL HYSTERECTOMY    . CYSTOSCOPY WITH RETROGRADE PYELOGRAM, URETEROSCOPY AND STENT PLACEMENT Right 12/19/2020   Procedure: CYSTOSCOPY WITH RETROGRADE PYELOGRAM, URETEROSCOPY DIAGNOSTIC;  Surgeon: Cleon Gustin, MD;  Location: [facility];  Service: Urology;  Laterality: Right;  . IR NEPHROSTOMY PLACEMENT RIGHT  12/23/2020    Home Medications:  Allergies as of 01/28/2021      Reactions   Cipro [ciprofloxacin-ciproflox Hcl Er] Hives      Medication List       Accurate as of Jan 28, 2021  2:48 PM. If you have any questions, ask your nurse or doctor.        aspirin EC 81 MG tablet Take 81 mg by mouth daily. Swallow whole.   hydrochlorothiazide 12.5 MG capsule Commonly known as: MICROZIDE Take 12.5 mg by mouth daily.   losartan 100 MG tablet Commonly known as: COZAAR Take 100 mg by mouth daily.   oxyCODONE-acetaminophen 5-325 MG tablet Commonly known as: Percocet Take 1 tablet by mouth every 4 (four) hours as needed. What changed: reasons to take  this   rosuvastatin 10 MG tablet Commonly known as: CRESTOR Take 10 mg by mouth at bedtime.   vitamin B-12 500 MCG tablet Commonly known as: CYANOCOBALAMIN Take 500 mcg by mouth daily.   VITAMIN D PO Take 1 capsule by mouth daily.       Allergies:  Allergies  Allergen Reactions  . Cipro [Ciprofloxacin-Ciproflox Hcl Er] Hives    Family History: No family history on file.  Social History:  reports that she has never smoked. She has never used smokeless tobacco. She reports that she does not drink alcohol and does not use drugs.  ROS: All other review of systems were reviewed and are negative except what is noted above in HPI  Physical Exam: There were no vitals taken for this visit.  Constitutional:  Alert and oriented, No acute distress. HEENT: [place] AT, moist mucus membranes.  Trachea midline, no masses. Cardiovascular: No clubbing, cyanosis, or edema. Respiratory: Normal respiratory effort, no increased work of breathing. GI: Abdomen is soft, nontender, nondistended, no abdominal masses GU: No CVA tenderness.  Lymph: No cervical or inguinal lymphadenopathy. Skin: No rashes, bruises or suspicious lesions. Neurologic: Grossly intact, no focal deficits, moving all 4 extremities. Psychiatric: Normal mood and affect.  Laboratory Data: Lab Results  Component Value Date   WBC 8.2 12/23/2020   HGB 13.2 12/23/2020   HCT 39.9 12/23/2020   MCV 90.7 12/23/2020   PLT 290 12/23/2020    Lab Results  Component Value Date   CREATININE  1.32 (H) 01/21/2021    No results found for: PSA  No results found for: TESTOSTERONE  No results found for: HGBA1C  Urinalysis    Component Value Date/Time   APPEARANCEUR Clear 12/12/2020 1018   GLUCOSEU Negative 12/12/2020 1018   BILIRUBINUR Negative 12/12/2020 1018   PROTEINUR Negative 12/12/2020 1018   UROBILINOGEN negative (A) 01/02/2020 0913   NITRITE Negative 12/12/2020 1018   LEUKOCYTESUR Negative 12/12/2020 1018    Lab  Results  Component Value Date   LABMICR See below: 12/12/2020   WBCUA 0-5 12/12/2020   LABEPIT 0-10 12/12/2020   BACTERIA Few 12/12/2020    Pertinent Imaging: Lasix renogram: Images reviewed and discussed with patient. No results found for this or any previous visit.  No results found for this or any previous visit.  No results found for this or any previous visit.  No results found for this or any previous visit.  No results found for this or any previous visit.  No results found for this or any previous visit.  Results for orders placed during the hospital encounter of 12/06/19  CT HEMATURIA WORKUP  Narrative CLINICAL DATA:  Chronic UTI for 2 years. Hematuria.  EXAM: CT ABDOMEN AND PELVIS WITHOUT AND WITH CONTRAST  TECHNIQUE: Multidetector CT imaging of the abdomen and pelvis was performed following the standard protocol before and following the bolus administration of intravenous contrast.  CONTRAST:  19mL OMNIPAQUE IOHEXOL 300 MG/ML  SOLN  COMPARISON:  No prior images are available for review. Report from 2018 is noted.  FINDINGS: Lower chest: Basilar atelectasis. No effusion.  Hepatobiliary: Mildly lobular hepatic contours. No signs of focal hepatic lesion. Biliary tree is normal. Portal vein is patent.  Pancreas: Pancreas without signs of inflammation, ductal dilation or focal lesion.  Spleen: Spleen is normal size without focal lesion.  Adrenals/Urinary Tract: Adrenal glands are normal.  Renal enhancement is symmetric. No evidence of hydronephrosis. Urinary bladder displaced by large pelvic mass. No suspicious renal lesion.  Excretory phase with limited assessment of the distal ureters. No secondary signs of stricture. The no lesion in the upper tracts.  No urinary tract calculus.  Stomach/Bowel: Small hiatal hernia. No bowel obstruction or acute bowel process. Normal appendix. Sigmoid diverticulosis.  Vascular/Lymphatic: Atheromatous changes  in the abdominal aorta. No aneurysm. No adenopathy in the retroperitoneum or in the upper abdomen. No pelvic nodal enlargement. Hazy central small bowel mesentery with scattered small lymph nodes.  Reproductive: Uterus is not identifiable as a separate structure. There is a large mass in the pelvis with some subtle peripheral calcification. Lesion does not show infiltrative margins and measures approximately 15.1 x 14.7 x 11.2 cm. The uterus appears to be displaced along the left lateral margin of the mass.  The right and left ovary are likely also displaced but not well defined as discrete structures on the current CT study with engorgement of ovarian veins more so on the right than the left.  Other: No ascites.  Musculoskeletal: Spinal degenerative changes without acute or destructive bone finding.  IMPRESSION: 1. 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 There are no infiltrative margins aside from the inability to separate discrete pelvic reproductive structures from this process. Also the absence of adenopathy is reassuring. There is an ultrasound report available from 2007 that also references a mass. Correlate with any surgeries in the interim and with follow-up pelvic imaging with ultrasound or MRI as warranted. 2. No  evidence of urinary tract calculus or obstruction. No signs of upper tract lesion to explain hematuria though the mid right ureter and distal right ureter show limited assessment due to lack of opacification. 3. Mildly lobular hepatic contours may reflect underlying cirrhosis. No focal hepatic lesion. 4. Sigmoid diverticulosis. 5. Small hiatal hernia. 6. Aortic atherosclerosis.  Aortic Atherosclerosis (ICD10-I70.0).   Electronically Signed By: Zetta Bills M.D. On: 12/07/2019 09:09  Results for orders placed in visit on 12/12/20  CT RENAL STONE STUDY  Narrative CLINICAL  DATA:  Hydronephrosis, intermittent right flank pain, chronic cystitis, history of kidney stones  EXAM: CT ABDOMEN AND PELVIS WITHOUT CONTRAST  TECHNIQUE: Multidetector CT imaging of the abdomen and pelvis was performed following the standard protocol without IV contrast.  COMPARISON:  08/29/2020  FINDINGS: Lower chest: No acute abnormality.  Hepatobiliary: No solid liver abnormality is seen. No gallstones, gallbladder wall thickening, or biliary dilatation.  Pancreas: Unremarkable. No pancreatic ductal dilatation or surrounding inflammatory changes.  Spleen: Normal in size without significant abnormality.  Adrenals/Urinary Tract: Adrenal glands are unremarkable. There is redemonstrated severe right hydronephrosis and hydroureter to a level just proximal to the ureterovesicular junction, with a knuckled, tethered appearance of the most distal right ureter adjacent to the hysterectomy bed (series 2, image 68). No evidence of urinary tract calculus. Bladder is unremarkable.  Stomach/Bowel: Stomach is within normal limits. Appendix appears normal. No evidence of bowel wall thickening, distention, or inflammatory changes. Sigmoid diverticulosis.  Vascular/Lymphatic: Aortic atherosclerosis. No enlarged abdominal or pelvic lymph nodes.  Reproductive: No mass or other significant abnormality.  Other: No abdominal wall hernia or abnormality. No abdominopelvic ascites. A percutaneous pigtail drainage catheter previously positioned in the right hemipelvis has been removed.  Musculoskeletal: No acute or significant osseous findings.  IMPRESSION: 1. There is redemonstrated severe right hydronephrosis and hydroureter to a level just proximal to the ureterovesicular junction, with a knuckled, tethered appearance of the most distal right ureter adjacent to the hysterectomy bed. This appearance is essentially unchanged compared to CT dated 08/29/2020.  2. No evidence of urinary  tract calculus. No left-sided hydronephrosis.  3. A percutaneous pigtail drainage catheter previously positioned in the right hemipelvis has been removed.   Electronically Signed By: Eddie Candle M.D. On: 12/12/2020 13:05   Assessment & Plan:   1. Right ureteral stricture -We discussed the management of ureteral stricture including ureteral reimplant versus nephrectomy and after discussing the options the patient elects for right ureteral reimplant.   No follow-ups on file.  Nicolette Bang, MD  [facility] [place]

## 2021-01-28 NOTE — Patient Instructions (Signed)
Hydronephrosis  Hydronephrosis is the swelling of one or both kidneys due to a blockage that stops urine from flowing out of the body. Kidneys filter waste from the blood and produce urine. This condition can lead to kidney failure and may become life-threatening if not treated promptly. What are the causes? In infants and children, common causes include problems that occur when a baby is developing in the womb. These can include problems in the kidneys or in the tubes that drain urine into the bladder (ureters). In adults, common causes include:  Kidney stones.  Pregnancy.  A tumor or cyst in the abdomen or pelvis.  An enlarged prostate gland. Other causes include:  Bladder infection.  Scar tissue from a previous surgery or injury.  A blood clot.  Cancer of the prostate, bladder, uterus, ovary, or colon. What are the signs or symptoms? Symptoms of this condition include:  Pain or discomfort in your side (flank) or abdomen.  Swelling in your abdomen.  Nausea and vomiting.  Fever.  Pain when passing urine.  Feelings of urgency when you need to urinate.  Urinating more often than normal. In some cases, you may not have any symptoms. How is this diagnosed? This condition may be diagnosed based on:  Your symptoms and medical history.  A physical exam.  Blood and urine tests.  Imaging tests, such as an ultrasound, CT scan, or MRI.  A procedure to look at your urinary tract and bladder by inserting a scope into the urethra (cystoscopy). How is this treated? Treatment for this condition depends on where the blockage is, how long it has been there, and what caused it. The goal of treatment is to remove the blockage. Treatment may include:  Antibiotic medicines to treat or prevent infection.  A procedure to place a small, thin tube (stent) into a blocked ureter. The stent will keep the ureter open so that urine can drain through it.  A nonsurgical procedure that  crushes kidney stones with shock waves (extracorporeal shock wave lithotripsy).  If kidney failure occurs, treatment may include dialysis or a kidney transplant. Follow these instructions at home:  Take over-the-counter and prescription medicines only as told by your health care provider.  If you were prescribed an antibiotic medicine, take it exactly as told by your health care provider. Do not stop taking the antibiotic even if you start to feel better.  Rest and return to your normal activities as told by your health care provider. Ask your health care provider what activities are safe for you.  Drink enough fluid to keep your urine pale yellow.  Keep all follow-up visits. This is important.   Contact a health care provider if:  You continue to have symptoms after treatment.  You develop new symptoms.  Your urine becomes cloudy or bloody.  You have a fever. Get help right away if:  You have severe flank or abdominal pain.  You cannot drink fluids without vomiting. Summary  Hydronephrosis is the swelling of one or both kidneys due to a blockage that stops urine from flowing out of the body.  Hydronephrosis can lead to kidney failure and may become life-threatening if not treated promptly.  The goal of treatment is to remove the blockage. It may include a procedure to insert a stent into a blocked ureter, a procedure to break up kidney stones, or taking antibiotic medicines.  Follow your health care provider's instructions for taking care of yourself at home, including instructions about drinking fluids, taking   medicines, and limiting activities. This information is not intended to replace advice given to you by your health care provider. Make sure you discuss any questions you have with your health care provider. Document Revised: 12/25/2019 Document Reviewed: 12/25/2019 Elsevier Patient Education  2021 [employer]  

## 2021-02-04 ENCOUNTER — Other Ambulatory Visit: Payer: Self-pay

## 2021-02-04 DIAGNOSIS — N131 Hydronephrosis with ureteral stricture, not elsewhere classified: Secondary | ICD-10-CM

## 2021-02-04 MED ORDER — MAGNESIUM CITRATE PO SOLN: 1.0000 | Freq: Once | ORAL | 0 refills | Status: AC

## 2021-02-17 ENCOUNTER — Ambulatory Visit (HOSPITAL_COMMUNITY)
Admission: RE | Admit: 2021-02-17 | Discharge: 2021-02-17 | Disposition: A | Discharge status: Home / Self Care | Payer: Medicare Other | Source: Ambulatory Visit | Attending: Urology | Admitting: Urology

## 2021-02-17 ENCOUNTER — Other Ambulatory Visit (HOSPITAL_COMMUNITY): Payer: Self-pay | Admitting: Interventional Radiology

## 2021-02-17 ENCOUNTER — Other Ambulatory Visit: Payer: Self-pay

## 2021-02-17 DIAGNOSIS — Z436 Encounter for attention to other artificial openings of urinary tract: Secondary | ICD-10-CM | POA: Insufficient documentation

## 2021-02-17 DIAGNOSIS — N1330 Unspecified hydronephrosis: Secondary | ICD-10-CM | POA: Diagnosis not present

## 2021-02-17 DIAGNOSIS — N1339 Other hydronephrosis: Secondary | ICD-10-CM

## 2021-02-17 DIAGNOSIS — N131 Hydronephrosis with ureteral stricture, not elsewhere classified: Secondary | ICD-10-CM | POA: Insufficient documentation

## 2021-02-17 HISTORY — PX: IR NEPHROSTOMY TUBE CHANGE: IMG1442

## 2021-02-17 MED ORDER — IOHEXOL 300 MG/ML  SOLN
10.0000 mL | Freq: Once | INTRAMUSCULAR | Status: AC | PRN
  Administered 2021-02-17: 6 mL

## 2021-02-17 MED ORDER — LIDOCAINE HCL 1 % IJ SOLN
INTRAMUSCULAR | Status: AC
  Filled 2021-02-17: qty 20

## 2021-02-17 NOTE — Procedures (Signed)
Interventional Radiology Procedure Note  Procedure: RT PCN EXCHG    Complications: None  Estimated Blood Loss:  0  Findings: FULL REPORT IN PACS     Tamera Punt, MD

## 2021-02-18 ENCOUNTER — Encounter (HOSPITAL_COMMUNITY): Payer: Self-pay

## 2021-02-18 ENCOUNTER — Emergency Department (HOSPITAL_COMMUNITY): Payer: Medicare Other

## 2021-02-18 ENCOUNTER — Other Ambulatory Visit: Payer: Self-pay

## 2021-02-18 ENCOUNTER — Telehealth: Payer: Self-pay | Admitting: Urology

## 2021-02-18 ENCOUNTER — Emergency Department (HOSPITAL_COMMUNITY)
Admission: EM | Admit: 2021-02-18 | Discharge: 2021-02-19 | Disposition: A | Discharge status: Home / Self Care | Payer: Medicare Other | Attending: Emergency Medicine | Admitting: Emergency Medicine

## 2021-02-18 DIAGNOSIS — R000 Tachycardia, unspecified: Secondary | ICD-10-CM | POA: Diagnosis not present

## 2021-02-18 DIAGNOSIS — N3001 Acute cystitis with hematuria: Secondary | ICD-10-CM | POA: Diagnosis not present

## 2021-02-18 DIAGNOSIS — Z20822 Contact with and (suspected) exposure to covid-19: Secondary | ICD-10-CM | POA: Insufficient documentation

## 2021-02-18 DIAGNOSIS — Z7982 Long term (current) use of aspirin: Secondary | ICD-10-CM | POA: Insufficient documentation

## 2021-02-18 DIAGNOSIS — R0602 Shortness of breath: Secondary | ICD-10-CM | POA: Diagnosis not present

## 2021-02-18 DIAGNOSIS — I10 Essential (primary) hypertension: Secondary | ICD-10-CM | POA: Diagnosis not present

## 2021-02-18 DIAGNOSIS — Z87442 Personal history of urinary calculi: Secondary | ICD-10-CM | POA: Diagnosis not present

## 2021-02-18 DIAGNOSIS — K5750 Diverticulosis of both small and large intestine without perforation or abscess without bleeding: Secondary | ICD-10-CM | POA: Diagnosis not present

## 2021-02-18 DIAGNOSIS — N12 Tubulo-interstitial nephritis, not specified as acute or chronic: Secondary | ICD-10-CM | POA: Diagnosis not present

## 2021-02-18 DIAGNOSIS — R509 Fever, unspecified: Secondary | ICD-10-CM | POA: Diagnosis not present

## 2021-02-18 DIAGNOSIS — Z79899 Other long term (current) drug therapy: Secondary | ICD-10-CM | POA: Diagnosis not present

## 2021-02-18 DIAGNOSIS — M5136 Other intervertebral disc degeneration, lumbar region: Secondary | ICD-10-CM | POA: Diagnosis not present

## 2021-02-18 DIAGNOSIS — R531 Weakness: Secondary | ICD-10-CM | POA: Diagnosis not present

## 2021-02-18 LAB — LACTIC ACID, PLASMA: Lactic Acid, Venous: 1.3 mmol/L (ref 0.5–1.9)

## 2021-02-18 LAB — COMPREHENSIVE METABOLIC PANEL
ALT: 11 U/L (ref 0–44)
AST: 15 U/L (ref 15–41)
Albumin: 4.3 g/dL (ref 3.5–5.0)
Alkaline Phosphatase: 96 U/L (ref 38–126)
Anion gap: 11 (ref 5–15)
BUN: 21 mg/dL (ref 8–23)
CO2: 25 mmol/L (ref 22–32)
Calcium: 9.3 mg/dL (ref 8.9–10.3)
Chloride: 96 mmol/L — ABNORMAL LOW (ref 98–111)
Creatinine, Ser: 1.48 mg/dL — ABNORMAL HIGH (ref 0.44–1.00)
GFR, Estimated: 37 mL/min — ABNORMAL LOW (ref >60)
Glucose, Bld: 117 mg/dL — ABNORMAL HIGH (ref 70–99)
Potassium: 3.5 mmol/L (ref 3.5–5.1)
Sodium: 132 mmol/L — ABNORMAL LOW (ref 135–145)
Total Bilirubin: 1.4 mg/dL — ABNORMAL HIGH (ref 0.3–1.2)
Total Protein: 8.1 g/dL (ref 6.5–8.1)

## 2021-02-18 LAB — URINALYSIS, ROUTINE W REFLEX MICROSCOPIC
Bilirubin Urine: NEGATIVE
Glucose, UA: NEGATIVE mg/dL
Ketones, ur: NEGATIVE mg/dL
Leukocytes,Ua: NEGATIVE
Nitrite: NEGATIVE
Protein, ur: NEGATIVE mg/dL
Specific Gravity, Urine: 1.011 (ref 1.005–1.030)
pH: 5 (ref 5.0–8.0)

## 2021-02-18 LAB — CBC WITH DIFFERENTIAL/PLATELET
Abs Immature Granulocytes: 0.06 10*3/uL (ref 0.00–0.07)
Basophils Absolute: 0 10*3/uL (ref 0.0–0.1)
Basophils Relative: 0 %
Eosinophils Absolute: 0 10*3/uL (ref 0.0–0.5)
Eosinophils Relative: 0 %
HCT: 40.9 % (ref 36.0–46.0)
Hemoglobin: 13.3 g/dL (ref 12.0–15.0)
Immature Granulocytes: 0 %
Lymphocytes Relative: 10 %
Lymphs Abs: 1.3 10*3/uL (ref 0.7–4.0)
MCH: 30.1 pg (ref 26.0–34.0)
MCHC: 32.5 g/dL (ref 30.0–36.0)
MCV: 92.5 fL (ref 80.0–100.0)
Monocytes Absolute: 1.4 10*3/uL — ABNORMAL HIGH (ref 0.1–1.0)
Monocytes Relative: 11 %
Neutro Abs: 10.5 10*3/uL — ABNORMAL HIGH (ref 1.7–7.7)
Neutrophils Relative %: 79 %
Platelets: 211 10*3/uL (ref 150–400)
RBC: 4.42 MIL/uL (ref 3.87–5.11)
RDW: 12.7 % (ref 11.5–15.5)
WBC: 13.4 10*3/uL — ABNORMAL HIGH (ref 4.0–10.5)
nRBC: 0 % (ref 0.0–0.2)

## 2021-02-18 LAB — RESP PANEL BY RT-PCR (FLU A&B, COVID) ARPGX2
Influenza A by PCR: NEGATIVE
Influenza B by PCR: NEGATIVE
SARS Coronavirus 2 by RT PCR: NEGATIVE

## 2021-02-18 MED ORDER — SODIUM CHLORIDE 0.9 % IV BOLUS
1000.0000 mL | Freq: Once | INTRAVENOUS | Status: AC
  Administered 2021-02-18: 1000 mL via INTRAVENOUS

## 2021-02-18 MED ORDER — SODIUM CHLORIDE 0.9 % IV SOLN
3.0000 g | Freq: Once | INTRAVENOUS | Status: AC
  Administered 2021-02-19: 3 g via INTRAVENOUS
  Filled 2021-02-18: qty 8

## 2021-02-18 MED ORDER — SODIUM CHLORIDE 0.9 % IV SOLN: INTRAVENOUS | Status: DC

## 2021-02-18 MED ORDER — ACETAMINOPHEN 325 MG PO TABS
650.0000 mg | ORAL_TABLET | Freq: Once | ORAL | Status: AC
  Administered 2021-02-18: 650 mg via ORAL
  Filled 2021-02-18: qty 2

## 2021-02-18 NOTE — ED Triage Notes (Signed)
Pt to er, pt states that she is here today for general weakness, states that she was at [facility] long yesterday and had her kidney drainage bag changed, states that she talked with MD Mackenzie's rn and they told her to come in an get ruled out of a kidney infection.

## 2021-02-18 NOTE — ED Provider Notes (Signed)
[facility] EMERGENCY DEPARTMENT Provider Note   CSN: [id number] Arrival date & time: 02/18/21  1951     History Chief Complaint  Patient presents with  . Weakness    Amanda Case is a 75 y.o. female.  Patient has right-sided nephrostomy tube.  That was changed recently.  Patient is scheduled June 23 for surgery with Dr. Alyson Ingles.  Patient had a blocked ureter.  Patient talk to Dr. Alyson Ingles and he recommended that she get seen for possible infection since she had fatigue and fevers.  Patient denies any nausea vomiting or diarrhea no dysuria with making her normal urine.  No cough or congestion.  Past medical history significant for kidney stone hypertension and high cholesterol.        Past Medical History:  Diagnosis Date  . Hypercholesterolemia   . Hypertension   . Kidney stone     Patient Active Problem List   Diagnosis Date Noted  . Dysuria 11/14/2019  . Chronic cystitis with hematuria 11/14/2019  . Gross hematuria 11/14/2019    Past Surgical History:  Procedure Laterality Date  . ABDOMINAL HYSTERECTOMY    . CYSTOSCOPY WITH RETROGRADE PYELOGRAM, URETEROSCOPY AND STENT PLACEMENT Right 12/19/2020   Procedure: CYSTOSCOPY WITH RETROGRADE PYELOGRAM, URETEROSCOPY DIAGNOSTIC;  Surgeon: Cleon Gustin, MD;  Location: [facility];  Service: Urology;  Laterality: Right;  . IR NEPHROSTOMY PLACEMENT RIGHT  12/23/2020  . IR NEPHROSTOMY TUBE CHANGE  02/17/2021     OB History   No obstetric history on file.     History reviewed. No pertinent family history.  Social History   Tobacco Use  . Smoking status: Never Smoker  . Smokeless tobacco: Never Used  Vaping Use  . Vaping Use: Never used  Substance Use Topics  . Alcohol use: No  . Drug use: No    Home Medications Prior to Admission medications   Medication Sig Start Date End Date Taking? Authorizing Provider  amoxicillin-clavulanate (AUGMENTIN) 875-125 MG tablet Take 1 tablet by mouth every 12 (twelve) hours for 7  days. 02/19/21 02/26/21 Yes Fredia Sorrow, MD  aspirin EC 81 MG tablet Take 81 mg by mouth daily. Swallow whole.    [provider]  hydrochlorothiazide (MICROZIDE) 12.5 MG capsule Take 12.5 mg by mouth daily.    [provider]  losartan (COZAAR) 100 MG tablet Take 100 mg by mouth daily.    [provider]  oxyCODONE-acetaminophen (PERCOCET) 5-325 MG tablet Take 1 tablet by mouth every 4 (four) hours as needed. Patient taking differently: Take 1 tablet by mouth every 4 (four) hours as needed for moderate pain. 12/19/20 12/19/21  Cleon Gustin, MD  rosuvastatin (CRESTOR) 10 MG tablet Take 10 mg by mouth at bedtime.    [provider]  vitamin B-12 (CYANOCOBALAMIN) 500 MCG tablet Take 500 mcg by mouth daily.    [provider]  VITAMIN D PO Take 1 capsule by mouth daily.    [provider]    Allergies    Cipro [ciprofloxacin-ciproflox hcl er]  Review of Systems   Review of Systems  Constitutional: Positive for fatigue and fever. Negative for chills.  HENT: Negative for rhinorrhea and sore throat.   Eyes: Negative for visual disturbance.  Respiratory: Negative for cough and shortness of breath.   Cardiovascular: Negative for chest pain and leg swelling.  Gastrointestinal: Negative for abdominal pain, diarrhea, nausea and vomiting.  Genitourinary: Negative for dysuria.  Musculoskeletal: Negative for back pain and neck pain.  Skin: Negative for  rash.  Neurological: Negative for dizziness, light-headedness and headaches.  Hematological: Does not bruise/bleed easily.  Psychiatric/Behavioral: Negative for confusion.    Physical Exam Updated Vital Signs BP (!) 136/58   Pulse 100   Temp (!) 100.9 F (38.3 C) (Rectal)   Resp 18   Ht 1.727 m (5\' 8" )   Wt 81.6 kg   SpO2 98%   BMI 27.37 kg/m   Physical Exam Vitals and nursing note reviewed.  Constitutional:      General: She is not in acute distress.    Appearance: She is  well-developed.  HENT:     Head: Normocephalic and atraumatic.  Eyes:     Extraocular Movements: Extraocular movements intact.     Conjunctiva/sclera: Conjunctivae normal.     Pupils: Pupils are equal, round, and reactive to light.  Cardiovascular:     Rate and Rhythm: Regular rhythm. Tachycardia present.     Heart sounds: No murmur heard.   Pulmonary:     Effort: Pulmonary effort is normal. No respiratory distress.     Breath sounds: Normal breath sounds.  Abdominal:     General: There is no distension.     Palpations: Abdomen is soft.     Tenderness: There is no abdominal tenderness. There is no guarding.  Musculoskeletal:        General: Normal range of motion.     Cervical back: Normal range of motion and neck supple. No rigidity.  Skin:    General: Skin is warm and dry.     Capillary Refill: Capillary refill takes 2 to 3 seconds.  Neurological:     General: No focal deficit present.     Mental Status: She is alert and oriented to person, place, and time.     Cranial Nerves: No cranial nerve deficit.     Sensory: No sensory deficit.     ED Results / Procedures / Treatments   Labs (all labs ordered are listed, but only abnormal results are displayed) Labs Reviewed  COMPREHENSIVE METABOLIC PANEL - Abnormal; Notable for the following components:      Result Value   Sodium 132 (*)    Chloride 96 (*)    Glucose, Bld 117 (*)    Creatinine, Ser 1.48 (*)    Total Bilirubin 1.4 (*)    GFR, Estimated 37 (*)    All other components within normal limits  CBC WITH DIFFERENTIAL/PLATELET - Abnormal; Notable for the following components:   WBC 13.4 (*)    Neutro Abs 10.5 (*)    Monocytes Absolute 1.4 (*)    All other components within normal limits  URINALYSIS, ROUTINE W REFLEX MICROSCOPIC - Abnormal; Notable for the following components:   APPearance HAZY (*)    Hgb urine dipstick SMALL (*)    Bacteria, UA RARE (*)    All other components within normal limits  RESP PANEL  BY RT-PCR (FLU A&B, COVID) [id number]  URINE CULTURE  CULTURE, BLOOD (ROUTINE X 2)  CULTURE, BLOOD (ROUTINE X 2)  URINE CULTURE  LACTIC ACID, PLASMA  LACTIC ACID, PLASMA  URINALYSIS, ROUTINE W REFLEX MICROSCOPIC    EKG EKG Interpretation  Date/Time:  Wednesday February 18 2021 22:49:05 EDT Ventricular Rate:  101 PR Interval:  177 QRS Duration: 97 QT Interval:  343 QTC Calculation: 445 R Axis:   76 Text Interpretation: Sinus tachycardia Borderline T abnormalities, inferior leads Confirmed by Fredia Sorrow [id number]) on 02/18/2021 10:59:49 PM   Radiology IR Nephrostomy Tube Change  Result Date: 02/17/2021  INDICATION: Right hydronephrosis, right nephrostomy exchange EXAM: FLUOROSCOPIC 10 FRENCH RIGHT NEPHROSTOMY EXCHANGE COMPARISON:  01/21/2021 MEDICATIONS: 1% LIDOCAINE LOCAL ANESTHESIA/SEDATION: Moderate Sedation Time:  None. The patient was continuously monitored during the procedure by the interventional radiology nurse under my direct supervision. CONTRAST:  10 cc omni 300-administered into the collecting system(s) FLUOROSCOPY TIME:  Fluoroscopy Time: 0 minutes 54 seconds (9 mGy). COMPLICATIONS: None immediate. PROCEDURE: Informed written consent was obtained from the patient after a thorough discussion of the procedural risks, benefits and alternatives. All questions were addressed. Maximal Sterile Barrier Technique was utilized including caps, mask, sterile gowns, sterile gloves, sterile drape, hand hygiene and skin antiseptic. A timeout was performed prior to the initiation of the procedure. Previous imaging reviewed. Under sterile conditions and local anesthesia, the right 10 French nephrostomy catheter was successfully exchanged over an Amplatz guidewire. Retention loop formed in the renal pelvis. Contrast injection confirms position. Images obtained for documentation. Catheter secured with Prolene suture and connected to external gravity drainage bag. Sterile dressing applied. No immediate  complication. Patient tolerated the procedure well. IMPRESSION: Successful 10 French right nephrostomy exchange Electronically Signed   By: Jerilynn Mages.  Shick M.D.   On: 02/17/2021 11:54   DG Chest Port 1 View  Result Date: 02/18/2021 CLINICAL DATA:  Fever, short of breath, weakness EXAM: PORTABLE CHEST 1 VIEW COMPARISON:  None. FINDINGS: The heart size and mediastinal contours are within normal limits. Both lungs are clear. The visualized skeletal structures are unremarkable. IMPRESSION: No active disease. Electronically Signed   By: Randa Ngo M.D.   On: 02/18/2021 22:59   CT Renal Stone Study  Result Date: 02/18/2021 CLINICAL DATA:  Flank pain, kidney stone, fever, recent nephrostomy tube change EXAM: CT ABDOMEN AND PELVIS WITHOUT CONTRAST TECHNIQUE: Multidetector CT imaging of the abdomen and pelvis was performed following the standard protocol without IV contrast. Sagittal and coronal MPR images reconstructed from axial data set. COMPARISON:  12/12/2020 FINDINGS: Lower chest: Subsegmental atelectasis LEFT lower lobe. RIGHT lung base clear. Hepatobiliary: Gallbladder and liver normal appearance Pancreas: Normal appearance Spleen: Normal appearance Adrenals/Urinary Tract: Adrenal glands, LEFT kidney, and LEFT ureter normal appearance. RIGHT nephrostomy tube present. No definite RIGHT renal mass or hydronephrosis. RIGHT ureter decompressed without identified urinary tract calcification. Bladder unremarkable. Stomach/Bowel: Normal appendix. Diverticulosis of sigmoid colon without evidence of diverticulitis. Few ascending colonic diverticula. And remaining bowel loops unremarkable. Vascular/Lymphatic: Atherosclerotic calcifications aorta without aneurysm. No adenopathy. Reproductive: Uterus surgically absent. Nonvisualization of ovaries. Other: No free air or free fluid. Minimal stranding in small-bowel mesentery question mint fibrosing mesenteritis unchanged. No hernia or acute inflammatory process.  Musculoskeletal: Bones demineralized with degenerative disc disease changes lumbar spine. IMPRESSION: RIGHT nephrostomy tube without hydronephrosis, hydroureter, or adjacent fluid collection. Colonic diverticulosis without evidence of diverticulitis. No acute intra-abdominal or intrapelvic abnormalities. Aortic Atherosclerosis (ICD10-I70.0). Electronically Signed   By: Lavonia Dana M.D.   On: 02/18/2021 22:00    Procedures Procedures   CRITICAL CARE Performed by: Fredia Sorrow Total critical care time: 35 minutes Critical care time was exclusive of separately billable procedures and treating other patients. Critical care was necessary to treat or prevent imminent or life-threatening deterioration. Critical care was time spent personally by me on the following activities: development of treatment plan with patient and/or surrogate as well as nursing, discussions with consultants, evaluation of patient's response to treatment, examination of patient, obtaining history from patient or surrogate, ordering and performing treatments and interventions, ordering and review of laboratory studies, ordering and review of radiographic studies, pulse  oximetry and re-evaluation of patient's condition.   Medications Ordered in ED Medications  0.9 %  sodium chloride infusion ( Intravenous New Bag/Given 02/18/21 2254)  Ampicillin-Sulbactam (UNASYN) 3 g in sodium chloride 0.9 % 100 mL IVPB (has no administration in time range)  sodium chloride 0.9 % bolus 1,000 mL (1,000 mLs Intravenous New Bag/Given 02/18/21 2254)  acetaminophen (TYLENOL) tablet 650 mg (650 mg Oral Given 02/18/21 2253)    ED Course  I have reviewed the triage vital signs and the nursing notes.  Pertinent labs & imaging results that were available during my care of the patient were reviewed by me and considered in my medical decision making (see chart for details).    MDM Rules/Calculators/A&P                          Patient nontoxic no  acute distress.  Temp here of 100.9.  Tachycardic.  Will work-up for infection but the nephrostomy tube may be source of infection.  Patient's renal function BUN 21 creatinine 1.48.  Liver function test without significant abnormalities.  Total bili 1.4.  Lactic acid 1.3.  COVID testing influenza testing negative.  Urine from her bladder without evidence of infection but culture sent.  Mild leukocytosis white blood cell count 13.4.  Chest x-ray negative.  CT stone renal stone study right nephrostomy tube without hydronephrosis hydroureter or adjacent fluid collection.  No other acute intra-abdominal or intrapelvic abnormalities.  Patient's GFR is baseline for her.  Discussed with Dr. Alyson Ingles on-call.  We will send the nephrostomy fluid for culture.  We will start her on Unasyn IV here and then continue Augmentin since she has a Cipro allergy.  He recommended treating her as if it was pyelonephritis.  He wants her to be seen a week prior to the next procedure on June 23 to make sure the urine is normal.  And that the nephrostomy fluid is normal.  Patient received 1 L of fluid.  Heart rates now below 100.  Did not meet distinct sepsis criteria.  White blood cell count less than 14 lactic acid normal.  Did have some tachycardia and fever.  Never had any hypotension.  Patient overall is feeling much better.  Final Clinical Impression(s) / ED Diagnoses Final diagnoses:  Pyelonephritis    Rx / DC Orders ED Discharge Orders         Ordered    amoxicillin-clavulanate (AUGMENTIN) 875-125 MG tablet  Every 12 hours        02/19/21 0006           Fredia Sorrow, MD 02/19/21 0015

## 2021-02-18 NOTE — Telephone Encounter (Signed)
Patient had left a message that she was feeling fine after her new tube placement and the bag for her Kidney yesterday at [facility]. She reports today that she is feeling weak and is not able to keep her eyes open.She did take an Ibuprofen to see if it would help and it did not help.  I advised her that the provider would recommend that she go to the ER to be evaluated and I would let the provider know that she is not feeling well and the recommendations after a tube placement. Please advise if any other recommendations.  Please advise.

## 2021-02-19 LAB — URINALYSIS, ROUTINE W REFLEX MICROSCOPIC
Bilirubin Urine: NEGATIVE
Glucose, UA: NEGATIVE mg/dL
Ketones, ur: NEGATIVE mg/dL
Nitrite: NEGATIVE
Protein, ur: 100 mg/dL — AB
Specific Gravity, Urine: 1.01 (ref 1.005–1.030)
WBC, UA: >50 WBC/hpf — ABNORMAL HIGH (ref 0–5)
pH: 6 (ref 5.0–8.0)

## 2021-02-19 MED ORDER — AMOXICILLIN-POT CLAVULANATE 875-125 MG PO TABS: 1.0000 | ORAL_TABLET | Freq: Two times a day (BID) | ORAL | 1 refills | Status: AC

## 2021-02-19 NOTE — Discharge Instructions (Signed)
Take the antibiotic Augmentin as directed for the next 7 days.  Make an appointment to follow-up with Dr. Noland Fordyce urology office to have your urine rechecked prior to your scheduled surgery on June 23.  Return for any new or worse symptoms or if you do not improve over the next 2 days.  Urine cultures have been sent.  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

## 2021-02-20 ENCOUNTER — Telehealth: Payer: Self-pay

## 2021-02-20 LAB — URINE CULTURE

## 2021-02-20 NOTE — Telephone Encounter (Signed)
Called to follow up with patient after ER visit per Dr. Alyson Ingles to bring patient into office 1 week prior to surgery.  Appointment scheduled with patient.

## 2021-02-21 LAB — URINE CULTURE: Culture: >=100000 — AB

## 2021-02-23 LAB — CULTURE, BLOOD (ROUTINE X 2)
Culture: NO GROWTH
Special Requests: ADEQUATE

## 2021-03-06 ENCOUNTER — Encounter: Payer: Self-pay | Admitting: Urology

## 2021-03-06 ENCOUNTER — Ambulatory Visit (INDEPENDENT_AMBULATORY_CARE_PROVIDER_SITE_OTHER): Payer: Medicare Other | Admitting: Urology

## 2021-03-06 ENCOUNTER — Other Ambulatory Visit: Payer: Self-pay | Admitting: Family Medicine

## 2021-03-06 ENCOUNTER — Other Ambulatory Visit: Payer: Self-pay

## 2021-03-06 VITALS — BP 136/89 | HR 89 | Temp 99.1°F | Resp 12 | Ht 68.0 in | Wt 180.0 lb

## 2021-03-06 DIAGNOSIS — N131 Hydronephrosis with ureteral stricture, not elsewhere classified: Secondary | ICD-10-CM

## 2021-03-06 LAB — URINALYSIS, ROUTINE W REFLEX MICROSCOPIC
Bilirubin, UA: NEGATIVE
Glucose, UA: NEGATIVE
Ketones, UA: NEGATIVE
Leukocytes,UA: NEGATIVE
Nitrite, UA: NEGATIVE
Protein,UA: NEGATIVE
Specific Gravity, UA: 1.01 (ref 1.005–1.030)
Urobilinogen, Ur: 0.2 mg/dL (ref 0.2–1.0)
pH, UA: 6.5 (ref 5.0–7.5)

## 2021-03-06 LAB — MICROSCOPIC EXAMINATION: Bacteria, UA: NONE SEEN

## 2021-03-06 MED ORDER — NITROFURANTOIN MONOHYD MACRO 100 MG PO CAPS
100.0000 mg | ORAL_CAPSULE | Freq: Two times a day (BID) | ORAL | 0 refills | Status: DC
Start: 2021-03-06 — End: 2021-03-06

## 2021-03-06 MED ORDER — NITROFURANTOIN MONOHYD MACRO 100 MG PO CAPS
100.0000 mg | ORAL_CAPSULE | Freq: Two times a day (BID) | ORAL | 0 refills | Status: DC
Start: 2021-03-06 — End: 2021-03-20

## 2021-03-06 NOTE — Patient Instructions (Signed)
Hydronephrosis ?Hydronephrosis is the swelling of one or both kidneys due to a blockage that stops urine from flowing out of the body. Kidneys filter waste from the blood and produce urine. This condition can lead to kidney failure and may become life-threatening if not treated promptly. ?What are the causes? ?In infants and children, common causes include problems that occur when a baby is developing in the womb. These can include problems in the kidneys or in the tubes that drain urine into the bladder (ureters). ?In adults, common causes include: ?Kidney stones. ?Pregnancy. ?A tumor or cyst in the abdomen or pelvis. ?An enlarged prostate gland. ?Other causes include: ?Bladder infection. ?Scar tissue from a previous surgery or injury. ?A blood clot. ?Cancer of the prostate, bladder, uterus, ovary, or colon. ?What are the signs or symptoms? ?Symptoms of this condition include: ?Pain or discomfort in your side (flank) or abdomen. ?Swelling in your abdomen. ?Nausea and vomiting. ?Fever. ?Pain when passing urine. ?Feelings of urgency when you need to urinate. ?Urinating more often than normal. ?In some cases, you may not have any symptoms. ?How is this diagnosed? ?This condition may be diagnosed based on: ?Your symptoms and medical history. ?A physical exam. ?Blood and urine tests. ?Imaging tests, such as an ultrasound, CT scan, or MRI. ?A procedure to look at your urinary tract and bladder by inserting a scope into the urethra (cystoscopy). ?How is this treated? ?Treatment for this condition depends on where the blockage is, how long it has been there, and what caused it. The goal of treatment is to remove the blockage. Treatment may include: ?Antibiotic medicines to treat or prevent infection. ?A procedure to place a small, thin tube (stent) into a blocked ureter. The stent will keep the ureter open so that urine can drain through it. ?A nonsurgical procedure that crushes kidney stones with shock waves  (extracorporeal shock wave lithotripsy). ?If kidney failure occurs, treatment may include dialysis or a kidney transplant. ?Follow these instructions at home: ? ?Take over-the-counter and prescription medicines only as told by your health care provider. ?If you were prescribed an antibiotic medicine, take it exactly as told by your health care provider. Do not stop taking the antibiotic even if you start to feel better. ?Rest and return to your normal activities as told by your health care provider. Ask your health care provider what activities are safe for you. ?Drink enough fluid to keep your urine pale yellow. ?Keep all follow-up visits. This is important. ?Contact a health care provider if: ?You continue to have symptoms after treatment. ?You develop new symptoms. ?Your urine becomes cloudy or bloody. ?You have a fever. ?Get help right away if: ?You have severe flank or abdominal pain. ?You cannot drink fluids without vomiting. ?Summary ?Hydronephrosis is the swelling of one or both kidneys due to a blockage that stops urine from flowing out of the body. ?Hydronephrosis can lead to kidney failure and may become life-threatening if not treated promptly. ?The goal of treatment is to remove the blockage. It may include a procedure to insert a stent into a blocked ureter, a procedure to break up kidney stones, or taking antibiotic medicines. ?Follow your health care provider's instructions for taking care of yourself at home, including instructions about drinking fluids, taking medicines, and limiting activities. ?This information is not intended to replace advice given to you by your health care provider. Make sure you discuss any questions you have with your health care provider. ?Document Revised: 12/25/2019 Document Reviewed: 12/25/2019 ?Elsevier Patient   Education ? 2022 Elsevier Inc. ? ?

## 2021-03-06 NOTE — Patient Instructions (Signed)
Amanda Case  03/06/2021     @PREFPERIOPPHARMACY @   Your procedure is scheduled on 03/12/2021.   Report to [facility] at  [facility]   Call this number if you have problems the morning of surgery:  [phone]   Remember:  Do not eat or drink after midnight.      Take these medicines the morning of surgery with A SIP OF WATER    None     Please brush your teeth.  Do not wear jewelry, make-up or nail polish.  Do not wear lotions, powders, or perfumes, or deodorant.  Do not shave 48 hours prior to surgery.  Men may shave face and neck.  Do not bring valuables to the hospital.  [facility] is not responsible for any belongings or valuables.  Contacts, dentures or bridgework may not be worn into surgery.  Leave your suitcase in the car.  After surgery it may be brought to your room.  For patients admitted to the hospital, discharge time will be determined by your treatment team.  Patients discharged the day of surgery will not be allowed to drive home.    Special instructions:  DO NOT smoke tobacco or vape for 24 hours before your procedure.  Please read over the following fact sheets that you were given. Coughing and Deep Breathing, Surgical Site Infection Prevention, Anesthesia Post-op Instructions, and Care and Recovery After Surgery      [email or web address].net/clinical-archive/[email or web address]-[email or web address]/">  Percutaneous Nephrostomy, Care After This sheet gives you information about how to care for yourself after your procedure. Your health care provider may also give you more specific instructions. If you have problems or questions, contact your health careprovider. What can I expect after the procedure? After the procedure, it is common to have: Some soreness where the nephrostomy tube was inserted (tube insertion site). Blood-tinged drainage from the nephrostomy tube for the first 24  hours. Follow these instructions at home: Activity  Do not lift anything that is heavier than 10 lb (4.5 kg), or the limit that you are told, until your health care provider says that it is safe. Return to your normal activities as told by your health care provider. Ask your health care provider what activities are safe for you. Avoid activities that may cause the nephrostomy tubing to bend. Do not take baths, swim, or use a hot tub until your health care provider approves. Ask your health care provider if you can take showers. Cover the nephrostomy tube bandage (dressing) with a watertight covering when you take a shower. If you were given a sedative during the procedure, it can affect you for several hours. Do not drive or operate machinery until your health care provider says that it is safe.  Care of the tube insertion site  Follow instructions from your health care provider about how to take care of your tube insertion site. Make sure you: Wash your hands with soap and water for at least 20 seconds before you change your dressing. If soap and water are not available, use hand sanitizer. Change your dressing as told by your health care provider. Be careful not to pull on the tube while removing the dressing. When you change the dressing, wash the skin around the tube, rinse well, and pat the skin dry. Check the tube insertion area every day for signs of infection. Check for: Redness, swelling, or pain. Fluid or blood. Warmth. Pus or a bad smell.  Care  of the nephrostomy tube and drainage bag Always keep the tubing, the leg bag, or the bedside drainage bags below the level of the kidney so that your urine drains freely. When connecting your nephrostomy tube to a drainage bag, make sure that there are no kinks in the tubing and that your urine is draining freely. You may want to use an elastic bandage to wrap any exposed tubing that goes from the nephrostomy tube to any of the connecting  tubes. At night, you may want to connect your nephrostomy tube or the leg bag to a larger bedside drainage bag. Follow instructions from your health care provider about how to empty or change the drainage bag. Empty the drainage bag when it becomes ? full. Replace the drainage bag and any extension tubing that is connected to your nephrostomy tube every 7 days or as told by your health care provider. Your health care provider will explain how to change the drainage bag and extension tubing. General instructions Take over-the-counter and prescription medicines only as told by your health care provider. Keep all follow-up visits as told by your health care provider. This is important. The nephrostomy tube will need to be changed every 8-12 weeks. Contact a health care provider if: You have problems with any of the valves or tubing. You have persistent pain or soreness in your back. You have redness, swelling, or pain around your tube insertion site. You have fluid or blood coming from your tube insertion site. Your tube insertion site feels warm to the touch. You have pus or a bad smell coming from your tube insertion site. You have increased urine output or you feel burning when urinating. Get help right away if: You have pain in your abdomen during the first week. You have chest pain or have trouble breathing. You have a new appearance of blood in your urine. You have a fever or chills. You have back pain that is not relieved by your medicine. You have decreased urine output. Your nephrostomy tube comes out. Summary After the procedure, it is common to have some soreness where the nephrostomy tube was inserted (tube insertion site). Follow instructions from your health care provider about how to take care of your tube insertion site, nephrostomy tube, and drainage bag. Keep all follow-up visits for care and for changing the tube. This information is not intended to replace advice given  to you by your health care provider. Make sure you discuss any questions you have with your healthcare provider. Document Revised: 10/02/2019 Document Reviewed: 10/02/2019 Elsevier Patient Education  2022 [place] Anesthesia, Adult, Care After This sheet gives you information about how to care for yourself after your procedure. Your health care provider may also give you more specific instructions. If you have problems or questions, contact your health careprovider. What can I expect after the procedure? After the procedure, the following side effects are common: Pain or discomfort at the IV site. Nausea. Vomiting. Sore throat. Trouble concentrating. Feeling cold or chills. Feeling weak or tired. Sleepiness and fatigue. Soreness and body aches. These side effects can affect parts of the body that were not involved in surgery. Follow these instructions at home: For the time period you were told by your health care provider:  Rest. Do not participate in activities where you could fall or become injured. Do not drive or use machinery. Do not drink alcohol. Do not take sleeping pills or medicines that cause drowsiness. Do not make important decisions or  sign legal documents. Do not take care of children on your own.  Eating and drinking Follow any instructions from your health care provider about eating or drinking restrictions. When you feel hungry, start by eating small amounts of foods that are soft and easy to digest (bland), such as toast. Gradually return to your regular diet. Drink enough fluid to keep your urine pale yellow. If you vomit, rehydrate by drinking water, juice, or clear broth. General instructions If you have sleep apnea, surgery and certain medicines can increase your risk for breathing problems. Follow instructions from your health care provider about wearing your sleep device: Anytime you are sleeping, including during daytime naps. While taking  prescription pain medicines, sleeping medicines, or medicines that make you drowsy. Have a responsible adult stay with you for the time you are told. It is important to have someone help care for you until you are awake and alert. Return to your normal activities as told by your health care provider. Ask your health care provider what activities are safe for you. Take over-the-counter and prescription medicines only as told by your health care provider. If you smoke, do not smoke without supervision. Keep all follow-up visits as told by your health care provider. This is important. Contact a health care provider if: You have nausea or vomiting that does not get better with medicine. You cannot eat or drink without vomiting. You have pain that does not get better with medicine. You are unable to pass urine. You develop a skin rash. You have a fever. You have redness around your IV site that gets worse. Get help right away if: You have difficulty breathing. You have chest pain. You have blood in your urine or stool, or you vomit blood. Summary After the procedure, it is common to have a sore throat or nausea. It is also common to feel tired. Have a responsible adult stay with you for the time you are told. It is important to have someone help care for you until you are awake and alert. When you feel hungry, start by eating small amounts of foods that are soft and easy to digest (bland), such as toast. Gradually return to your regular diet. Drink enough fluid to keep your urine pale yellow. Return to your normal activities as told by your health care provider. Ask your health care provider what activities are safe for you. This information is not intended to replace advice given to you by your health care provider. Make sure you discuss any questions you have with your healthcare provider. Document Revised: 05/22/2020 Document Reviewed: 12/20/2019 Elsevier Patient Education  2022 [place]. How to Use Chlorhexidine for Bathing Chlorhexidine gluconate (CHG) is a germ-killing (antiseptic) solution that is used to clean the skin. It can get rid of the bacteria that normally live on the skin and can keep them away for about 24 hours. To clean your skin with CHG, you may be given: A CHG solution to use in the shower or as part of a sponge bath. A prepackaged cloth that contains CHG. Cleaning your skin with CHG may help lower the risk for infection: While you are staying in the intensive care unit of the hospital. If you have a vascular access, such as a central line, to provide short-term or long-term access to your veins. If you have a catheter to drain urine from your bladder. If you are on a ventilator. A ventilator is a machine that helps you breathe by moving air  in and out of your lungs. After surgery. What are the risks? Risks of using CHG include: A skin reaction. Hearing loss, if CHG gets in your ears. Eye injury, if CHG gets in your eyes and is not rinsed out. The CHG product catching fire. Make sure that you avoid smoking and flames after applying CHG to your skin. Do not use CHG: If you have a chlorhexidine allergy or have previously reacted to chlorhexidine. On babies younger than 35 months of age. How to use CHG solution Use CHG only as told by your health care provider, and follow the instructions on the label. Use the full amount of CHG as directed. Usually, this is one bottle. During a shower Follow these steps when using CHG solution during a shower (unless your health care provider gives you different instructions): Start the shower. Use your normal soap and shampoo to wash your face and hair. Turn off the shower or move out of the shower stream. Pour the CHG onto a clean washcloth. Do not use any type of brush or rough-edged sponge. Starting at your neck, lather your body down to your toes. Make sure you follow these instructions: If you will be having  surgery, pay special attention to the part of your body where you will be having surgery. Scrub this area for at least 1 minute. Do not use CHG on your head or face. If the solution gets into your ears or eyes, rinse them well with water. Avoid your genital area. Avoid any areas of skin that have broken skin, cuts, or scrapes. Scrub your back and under your arms. Make sure to wash skin folds. Let the lather sit on your skin for 1-2 minutes or as long as told by your health care provider. Thoroughly rinse your entire body in the shower. Make sure that all body creases and crevices are rinsed well. Dry off with a clean towel. Do not put any substances on your body afterward--such as powder, lotion, or perfume--unless you are told to do so by your health care provider. Only use lotions that are recommended by the manufacturer. Put on clean clothes or pajamas. If it is the night before your surgery, sleep in clean sheets.  During a sponge bath Follow these steps when using CHG solution during a sponge bath (unless your health care provider gives you different instructions): Use your normal soap and shampoo to wash your face and hair. Pour the CHG onto a clean washcloth. Starting at your neck, lather your body down to your toes. Make sure you follow these instructions: If you will be having surgery, pay special attention to the part of your body where you will be having surgery. Scrub this area for at least 1 minute. Do not use CHG on your head or face. If the solution gets into your ears or eyes, rinse them well with water. Avoid your genital area. Avoid any areas of skin that have broken skin, cuts, or scrapes. Scrub your back and under your arms. Make sure to wash skin folds. Let the lather sit on your skin for 1-2 minutes or as long as told by your health care provider. Using a different clean, wet washcloth, thoroughly rinse your entire body. Make sure that all body creases and crevices are  rinsed well. Dry off with a clean towel. Do not put any substances on your body afterward--such as powder, lotion, or perfume--unless you are told to do so by your health care provider. Only use lotions that  are recommended by the manufacturer. Put on clean clothes or pajamas. If it is the night before your surgery, sleep in clean sheets. How to use CHG prepackaged cloths Only use CHG cloths as told by your health care provider, and follow the instructions on the label. Use the CHG cloth on clean, dry skin. Do not use the CHG cloth on your head or face unless your health care provider tells you to. When washing with the CHG cloth: Avoid your genital area. Avoid any areas of skin that have broken skin, cuts, or scrapes. Before surgery Follow these steps when using a CHG cloth to clean before surgery (unless your health care provider gives you different instructions): Using the CHG cloth, vigorously scrub the part of your body where you will be having surgery. Scrub using a back-and-forth motion for 3 minutes. The area on your body should be completely wet with CHG when you are done scrubbing. Do not rinse. Discard the cloth and let the area air-dry. Do not put any substances on the area afterward, such as powder, lotion, or perfume. Put on clean clothes or pajamas. If it is the night before your surgery, sleep in clean sheets.  For general bathing Follow these steps when using CHG cloths for general bathing (unless your health care provider gives you different instructions). Use a separate CHG cloth for each area of your body. Make sure you wash between any folds of skin and between your fingers and toes. Wash your body in the following order, switching to a new cloth after each step: The front of your neck, shoulders, and chest. Both of your arms, under your arms, and your hands. Your stomach and groin area, avoiding the genitals. Your right leg and foot. Your left leg and foot. The back of  your neck, your back, and your buttocks. Do not rinse. Discard the cloth and let the area air-dry. Do not put any substances on your body afterward--such as powder, lotion, or perfume--unless you are told to do so by your health care provider. Only use lotions that are recommended by the manufacturer. Put on clean clothes or pajamas. Contact a health care provider if: Your skin gets irritated after scrubbing. You have questions about using your solution or cloth. Get help right away if: Your eyes become very red or swollen. Your eyes itch badly. Your skin itches badly and is red or swollen. Your hearing changes. You have trouble seeing. You have swelling or tingling in your mouth or throat. You have trouble breathing. You swallow any chlorhexidine. Summary Chlorhexidine gluconate (CHG) is a germ-killing (antiseptic) solution that is used to clean the skin. Cleaning your skin with CHG may help to lower your risk for infection. You may be given CHG to use for bathing. It may be in a bottle or in a prepackaged cloth to use on your skin. Carefully follow your health care provider's instructions and the instructions on the product label. Do not use CHG if you have a chlorhexidine allergy. Contact your health care provider if your skin gets irritated after scrubbing. This information is not intended to replace advice given to you by your health care provider. Make sure you discuss any questions you have with your healthcare provider. Document Revised: 01/18/2020 Document Reviewed: 02/22/2020 Elsevier Patient Education  [place].

## 2021-03-06 NOTE — H&P (View-Only) (Signed)
03/06/2021 2:16 PM   Amanda Case Aug 06, 1946 [id number]  Referring provider: Berenice Primas [address] [place],  [place] [address]  Chief Complaint  Patient presents with   Follow-up    HPI: Amanda Case is a 75yo here for followup for right hydronephrosis. She has a right mid ureteral injury after hysterectomy. She has a right nephrostomy tube in place. She had sepsis after right neph tube exchange 6/1. Culture grew enterococcus. She is doing well today.    PMH: Past Medical History:  Diagnosis Date   Hypercholesterolemia    Hypertension    Kidney stone     Surgical History: Past Surgical History:  Procedure Laterality Date   ABDOMINAL HYSTERECTOMY     CYSTOSCOPY WITH RETROGRADE PYELOGRAM, URETEROSCOPY AND STENT PLACEMENT Right 12/19/2020   Procedure: CYSTOSCOPY WITH RETROGRADE PYELOGRAM, URETEROSCOPY DIAGNOSTIC;  Surgeon: Cleon Gustin, MD;  Location: [facility];  Service: Urology;  Laterality: Right;   IR NEPHROSTOMY PLACEMENT RIGHT  12/23/2020   IR NEPHROSTOMY TUBE CHANGE  02/17/2021    Home Medications:  Allergies as of 03/06/2021       Reactions   Cipro [ciprofloxacin-ciproflox Hcl Er] Hives        Medication List        Accurate as of March 06, 2021  2:16 PM. If you have any questions, ask your nurse or doctor.          aspirin EC 81 MG tablet Take 81 mg by mouth daily. Swallow whole.   cholecalciferol 25 MCG (1000 UNIT) tablet Commonly known as: VITAMIN D3 Take 1,000 Units by mouth daily.   hydrochlorothiazide 12.5 MG capsule Commonly known as: MICROZIDE Take 12.5 mg by mouth daily.   losartan 100 MG tablet Commonly known as: COZAAR Take 100 mg by mouth daily.   oxyCODONE-acetaminophen 5-325 MG tablet Commonly known as: Percocet Take 1 tablet by mouth every 4 (four) hours as needed.   rosuvastatin 10 MG tablet Commonly known as: CRESTOR Take 10 mg by mouth at bedtime.   vitamin B-12 500 MCG tablet Commonly known as:  CYANOCOBALAMIN Take 500 mcg by mouth daily.        Allergies:  Allergies  Allergen Reactions   Cipro [Ciprofloxacin-Ciproflox Hcl Er] Hives    Family History: No family history on file.  Social History:  reports that she has never smoked. She has never used smokeless tobacco. She reports that she does not drink alcohol and does not use drugs.  ROS: All other review of systems were reviewed and are negative except what is noted above in HPI  Physical Exam: BP 136/89   Pulse 89   Temp 99.1 F (37.3 C) (Oral)   Resp 12   Ht 5\' 8"  (1.727 m)   Wt 180 lb (81.6 kg)   BMI 27.37 kg/m   Constitutional:  Alert and oriented, No acute distress. HEENT: [place] AT, moist mucus membranes.  Trachea midline, no masses. Cardiovascular: No clubbing, cyanosis, or edema. Respiratory: Normal respiratory effort, no increased work of breathing. GI: Abdomen is soft, nontender, nondistended, no abdominal masses GU: No CVA tenderness.  Lymph: No cervical or inguinal lymphadenopathy. Skin: No rashes, bruises or suspicious lesions. Neurologic: Grossly intact, no focal deficits, moving all 4 extremities. Psychiatric: Normal mood and affect.  Laboratory Data: Lab Results  Component Value Date   WBC 13.4 (H) 02/18/2021   HGB 13.3 02/18/2021   HCT 40.9 02/18/2021   MCV 92.5 02/18/2021   PLT 211 02/18/2021    Lab  Results  Component Value Date   CREATININE 1.48 (H) 02/18/2021    No results found for: PSA  No results found for: TESTOSTERONE  No results found for: HGBA1C  Urinalysis    Component Value Date/Time   COLORURINE YELLOW 02/18/2021 2353   APPEARANCEUR CLOUDY (A) 02/18/2021 2353   APPEARANCEUR Clear 12/12/2020 1018   LABSPEC 1.010 02/18/2021 2353   PHURINE 6.0 02/18/2021 2353   GLUCOSEU NEGATIVE 02/18/2021 2353   HGBUR MODERATE (A) 02/18/2021 2353   BILIRUBINUR NEGATIVE 02/18/2021 2353   BILIRUBINUR Negative 12/12/2020 1018   KETONESUR NEGATIVE 02/18/2021 2353   PROTEINUR  100 (A) 02/18/2021 2353   UROBILINOGEN negative (A) 01/02/2020 0913   NITRITE NEGATIVE 02/18/2021 2353   LEUKOCYTESUR LARGE (A) 02/18/2021 2353    Lab Results  Component Value Date   LABMICR See below: 12/12/2020   WBCUA 0-5 12/12/2020   LABEPIT 0-10 12/12/2020   BACTERIA RARE (A) 02/18/2021    Pertinent Imaging:  No results found for this or any previous visit.  No results found for this or any previous visit.  No results found for this or any previous visit.  No results found for this or any previous visit.  No results found for this or any previous visit.  No results found for this or any previous visit.  Results for orders placed during the hospital encounter of 12/06/19  CT HEMATURIA WORKUP  Narrative CLINICAL DATA:  Chronic UTI for 2 years. Hematuria.  EXAM: CT ABDOMEN AND PELVIS WITHOUT AND WITH CONTRAST  TECHNIQUE: Multidetector CT imaging of the abdomen and pelvis was performed following the standard protocol before and following the bolus administration of intravenous contrast.  CONTRAST:  120mL OMNIPAQUE IOHEXOL 300 MG/ML  SOLN  COMPARISON:  No prior images are available for review. Report from 2018 is noted.  FINDINGS: Lower chest: Basilar atelectasis. No effusion.  Hepatobiliary: Mildly lobular hepatic contours. No signs of focal hepatic lesion. Biliary tree is normal. Portal vein is patent.  Pancreas: Pancreas without signs of inflammation, ductal dilation or focal lesion.  Spleen: Spleen is normal size without focal lesion.  Adrenals/Urinary Tract: Adrenal glands are normal.  Renal enhancement is symmetric. No evidence of hydronephrosis. Urinary bladder displaced by large pelvic mass. No suspicious renal lesion.  Excretory phase with limited assessment of the distal ureters. No secondary signs of stricture. The no lesion in the upper tracts.  No urinary tract calculus.  Stomach/Bowel: Small hiatal hernia. No bowel obstruction or  acute bowel process. Normal appendix. Sigmoid diverticulosis.  Vascular/Lymphatic: Atheromatous changes in the abdominal aorta. No aneurysm. No adenopathy in the retroperitoneum or in the upper abdomen. No pelvic nodal enlargement. Hazy central small bowel mesentery with scattered small lymph nodes.  Reproductive: Uterus is not identifiable as a separate structure. There is a large mass in the pelvis with some subtle peripheral calcification. Lesion does not show infiltrative margins and measures approximately 15.1 x 14.7 x 11.2 cm. The uterus appears to be displaced along the left lateral margin of the mass.  The right and left ovary are likely also displaced but not well defined as discrete structures on the current CT study with engorgement of ovarian veins more so on the right than the left.  Other: No ascites.  Musculoskeletal: Spinal degenerative changes without acute or destructive bone finding.  IMPRESSION: 1. 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 There are no infiltrative margins aside from the inability to  separate discrete pelvic reproductive structures from this process. Also the absence of adenopathy is reassuring. There is an ultrasound report available from 2007 that also references a mass. Correlate with any surgeries in the interim and with follow-up pelvic imaging with ultrasound or MRI as warranted. 2. No evidence of urinary tract calculus or obstruction. No signs of upper tract lesion to explain hematuria though the mid right ureter and distal right ureter show limited assessment due to lack of opacification. 3. Mildly lobular hepatic contours may reflect underlying cirrhosis. No focal hepatic lesion. 4. Sigmoid diverticulosis. 5. Small hiatal hernia. 6. Aortic atherosclerosis.  Aortic Atherosclerosis (ICD10-I70.0).   Electronically Signed By: Zetta Bills M.D. On: 12/07/2019  09:09  Results for orders placed during the hospital encounter of 02/18/21  CT Renal Stone Study  Narrative CLINICAL DATA:  Flank pain, kidney stone, fever, recent nephrostomy tube change  EXAM: CT ABDOMEN AND PELVIS WITHOUT CONTRAST  TECHNIQUE: Multidetector CT imaging of the abdomen and pelvis was performed following the standard protocol without IV contrast. Sagittal and coronal MPR images reconstructed from axial data set.  COMPARISON:  12/12/2020  FINDINGS: Lower chest: Subsegmental atelectasis LEFT lower lobe. RIGHT lung base clear.  Hepatobiliary: Gallbladder and liver normal appearance  Pancreas: Normal appearance  Spleen: Normal appearance  Adrenals/Urinary Tract: Adrenal glands, LEFT kidney, and LEFT ureter normal appearance. RIGHT nephrostomy tube present. No definite RIGHT renal mass or hydronephrosis. RIGHT ureter decompressed without identified urinary tract calcification. Bladder unremarkable.  Stomach/Bowel: Normal appendix. Diverticulosis of sigmoid colon without evidence of diverticulitis. Few ascending colonic diverticula. And remaining bowel loops unremarkable.  Vascular/Lymphatic: Atherosclerotic calcifications aorta without aneurysm. No adenopathy.  Reproductive: Uterus surgically absent. Nonvisualization of ovaries.  Other: No free air or free fluid. Minimal stranding in small-bowel mesentery question mint fibrosing mesenteritis unchanged. No hernia or acute inflammatory process.  Musculoskeletal: Bones demineralized with degenerative disc disease changes lumbar spine.  IMPRESSION: RIGHT nephrostomy tube without hydronephrosis, hydroureter, or adjacent fluid collection.  Colonic diverticulosis without evidence of diverticulitis.  No acute intra-abdominal or intrapelvic abnormalities.  Aortic Atherosclerosis (ICD10-I70.0).   Electronically Signed By: Lavonia Dana M.D. On: 02/18/2021 22:00   Assessment & Plan:    1.  Hydronephrosis with ureteral stricture, not elsewhere classified -We will proceed with right ureteral reimplant. Rx for macrobid given to take until her surgery - Urinalysis, Routine w reflex microscopic   No follow-ups on file.  Nicolette Bang, MD  [facility] [place]

## 2021-03-06 NOTE — Progress Notes (Signed)
03/06/2021 2:16 PM   Amanda Case 28-Feb-1946 [id number]  Referring provider: Berenice Primas [address] [place],  [place] [address]  Chief Complaint  Patient presents with   Follow-up    HPI: [name] is a 75yo here for followup for right hydronephrosis. She has a right mid ureteral injury after hysterectomy. She has a right nephrostomy tube in place. She had sepsis after right neph tube exchange 6/1. Culture grew enterococcus. She is doing well today.    PMH: Past Medical History:  Diagnosis Date   Hypercholesterolemia    Hypertension    Kidney stone     Surgical History: Past Surgical History:  Procedure Laterality Date   ABDOMINAL HYSTERECTOMY     CYSTOSCOPY WITH RETROGRADE PYELOGRAM, URETEROSCOPY AND STENT PLACEMENT Right 12/19/2020   Procedure: CYSTOSCOPY WITH RETROGRADE PYELOGRAM, URETEROSCOPY DIAGNOSTIC;  Surgeon: Cleon Gustin, MD;  Location: [facility];  Service: Urology;  Laterality: Right;   IR NEPHROSTOMY PLACEMENT RIGHT  12/23/2020   IR NEPHROSTOMY TUBE CHANGE  02/17/2021    Home Medications:  Allergies as of 03/06/2021       Reactions   Cipro [ciprofloxacin-ciproflox Hcl Er] Hives        Medication List        Accurate as of March 06, 2021  2:16 PM. If you have any questions, ask your nurse or doctor.          aspirin EC 81 MG tablet Take 81 mg by mouth daily. Swallow whole.   cholecalciferol 25 MCG (1000 UNIT) tablet Commonly known as: VITAMIN D3 Take 1,000 Units by mouth daily.   hydrochlorothiazide 12.5 MG capsule Commonly known as: MICROZIDE Take 12.5 mg by mouth daily.   losartan 100 MG tablet Commonly known as: COZAAR Take 100 mg by mouth daily.   oxyCODONE-acetaminophen 5-325 MG tablet Commonly known as: Percocet Take 1 tablet by mouth every 4 (four) hours as needed.   rosuvastatin 10 MG tablet Commonly known as: CRESTOR Take 10 mg by mouth at bedtime.   vitamin B-12 500 MCG tablet Commonly known as:  CYANOCOBALAMIN Take 500 mcg by mouth daily.        Allergies:  Allergies  Allergen Reactions   Cipro [Ciprofloxacin-Ciproflox Hcl Er] Hives    Family History: No family history on file.  Social History:  reports that she has never smoked. She has never used smokeless tobacco. She reports that she does not drink alcohol and does not use drugs.  ROS: All other review of systems were reviewed and are negative except what is noted above in HPI  Physical Exam: BP 136/89   Pulse 89   Temp 99.1 F (37.3 C) (Oral)   Resp 12   Ht 5\' 8"  (1.727 m)   Wt 180 lb (81.6 kg)   BMI 27.37 kg/m   Constitutional:  Alert and oriented, No acute distress. HEENT: [place] AT, moist mucus membranes.  Trachea midline, no masses. Cardiovascular: No clubbing, cyanosis, or edema. Respiratory: Normal respiratory effort, no increased work of breathing. GI: Abdomen is soft, nontender, nondistended, no abdominal masses GU: No CVA tenderness.  Lymph: No cervical or inguinal lymphadenopathy. Skin: No rashes, bruises or suspicious lesions. Neurologic: Grossly intact, no focal deficits, moving all 4 extremities. Psychiatric: Normal mood and affect.  Laboratory Data: Lab Results  Component Value Date   WBC 13.4 (H) 02/18/2021   HGB 13.3 02/18/2021   HCT 40.9 02/18/2021   MCV 92.5 02/18/2021   PLT 211 02/18/2021    Lab  Results  Component Value Date   CREATININE 1.48 (H) 02/18/2021    No results found for: PSA  No results found for: TESTOSTERONE  No results found for: HGBA1C  Urinalysis    Component Value Date/Time   COLORURINE YELLOW 02/18/2021 2353   APPEARANCEUR CLOUDY (A) 02/18/2021 2353   APPEARANCEUR Clear 12/12/2020 1018   LABSPEC 1.010 02/18/2021 2353   PHURINE 6.0 02/18/2021 2353   GLUCOSEU NEGATIVE 02/18/2021 2353   HGBUR MODERATE (A) 02/18/2021 2353   BILIRUBINUR NEGATIVE 02/18/2021 2353   BILIRUBINUR Negative 12/12/2020 1018   KETONESUR NEGATIVE 02/18/2021 2353   PROTEINUR  100 (A) 02/18/2021 2353   UROBILINOGEN negative (A) 01/02/2020 0913   NITRITE NEGATIVE 02/18/2021 2353   LEUKOCYTESUR LARGE (A) 02/18/2021 2353    Lab Results  Component Value Date   LABMICR See below: 12/12/2020   WBCUA 0-5 12/12/2020   LABEPIT 0-10 12/12/2020   BACTERIA RARE (A) 02/18/2021    Pertinent Imaging:  No results found for this or any previous visit.  No results found for this or any previous visit.  No results found for this or any previous visit.  No results found for this or any previous visit.  No results found for this or any previous visit.  No results found for this or any previous visit.  Results for orders placed during the hospital encounter of 12/06/19  CT HEMATURIA WORKUP  Narrative CLINICAL DATA:  Chronic UTI for 2 years. Hematuria.  EXAM: CT ABDOMEN AND PELVIS WITHOUT AND WITH CONTRAST  TECHNIQUE: Multidetector CT imaging of the abdomen and pelvis was performed following the standard protocol before and following the bolus administration of intravenous contrast.  CONTRAST:  176mL OMNIPAQUE IOHEXOL 300 MG/ML  SOLN  COMPARISON:  No prior images are available for review. Report from 2018 is noted.  FINDINGS: Lower chest: Basilar atelectasis. No effusion.  Hepatobiliary: Mildly lobular hepatic contours. No signs of focal hepatic lesion. Biliary tree is normal. Portal vein is patent.  Pancreas: Pancreas without signs of inflammation, ductal dilation or focal lesion.  Spleen: Spleen is normal size without focal lesion.  Adrenals/Urinary Tract: Adrenal glands are normal.  Renal enhancement is symmetric. No evidence of hydronephrosis. Urinary bladder displaced by large pelvic mass. No suspicious renal lesion.  Excretory phase with limited assessment of the distal ureters. No secondary signs of stricture. The no lesion in the upper tracts.  No urinary tract calculus.  Stomach/Bowel: Small hiatal hernia. No bowel obstruction or  acute bowel process. Normal appendix. Sigmoid diverticulosis.  Vascular/Lymphatic: Atheromatous changes in the abdominal aorta. No aneurysm. No adenopathy in the retroperitoneum or in the upper abdomen. No pelvic nodal enlargement. Hazy central small bowel mesentery with scattered small lymph nodes.  Reproductive: Uterus is not identifiable as a separate structure. There is a large mass in the pelvis with some subtle peripheral calcification. Lesion does not show infiltrative margins and measures approximately 15.1 x 14.7 x 11.2 cm. The uterus appears to be displaced along the left lateral margin of the mass.  The right and left ovary are likely also displaced but not well defined as discrete structures on the current CT study with engorgement of ovarian veins more so on the right than the left.  Other: No ascites.  Musculoskeletal: Spinal degenerative changes without acute or destructive bone finding.  IMPRESSION: 1. 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 There are no infiltrative margins aside from the inability to  separate discrete pelvic reproductive structures from this process. Also the absence of adenopathy is reassuring. There is an ultrasound report available from 2007 that also references a mass. Correlate with any surgeries in the interim and with follow-up pelvic imaging with ultrasound or MRI as warranted. 2. No evidence of urinary tract calculus or obstruction. No signs of upper tract lesion to explain hematuria though the mid right ureter and distal right ureter show limited assessment due to lack of opacification. 3. Mildly lobular hepatic contours may reflect underlying cirrhosis. No focal hepatic lesion. 4. Sigmoid diverticulosis. 5. Small hiatal hernia. 6. Aortic atherosclerosis.  Aortic Atherosclerosis (ICD10-I70.0).   Electronically Signed By: Zetta Bills M.D. On: 12/07/2019  09:09  Results for orders placed during the hospital encounter of 02/18/21  CT Renal Stone Study  Narrative CLINICAL DATA:  Flank pain, kidney stone, fever, recent nephrostomy tube change  EXAM: CT ABDOMEN AND PELVIS WITHOUT CONTRAST  TECHNIQUE: Multidetector CT imaging of the abdomen and pelvis was performed following the standard protocol without IV contrast. Sagittal and coronal MPR images reconstructed from axial data set.  COMPARISON:  12/12/2020  FINDINGS: Lower chest: Subsegmental atelectasis LEFT lower lobe. RIGHT lung base clear.  Hepatobiliary: Gallbladder and liver normal appearance  Pancreas: Normal appearance  Spleen: Normal appearance  Adrenals/Urinary Tract: Adrenal glands, LEFT kidney, and LEFT ureter normal appearance. RIGHT nephrostomy tube present. No definite RIGHT renal mass or hydronephrosis. RIGHT ureter decompressed without identified urinary tract calcification. Bladder unremarkable.  Stomach/Bowel: Normal appendix. Diverticulosis of sigmoid colon without evidence of diverticulitis. Few ascending colonic diverticula. And remaining bowel loops unremarkable.  Vascular/Lymphatic: Atherosclerotic calcifications aorta without aneurysm. No adenopathy.  Reproductive: Uterus surgically absent. Nonvisualization of ovaries.  Other: No free air or free fluid. Minimal stranding in small-bowel mesentery question mint fibrosing mesenteritis unchanged. No hernia or acute inflammatory process.  Musculoskeletal: Bones demineralized with degenerative disc disease changes lumbar spine.  IMPRESSION: RIGHT nephrostomy tube without hydronephrosis, hydroureter, or adjacent fluid collection.  Colonic diverticulosis without evidence of diverticulitis.  No acute intra-abdominal or intrapelvic abnormalities.  Aortic Atherosclerosis (ICD10-I70.0).   Electronically Signed By: Lavonia Dana M.D. On: 02/18/2021 22:00   Assessment & Plan:    1.  Hydronephrosis with ureteral stricture, not elsewhere classified -We will proceed with right ureteral reimplant. Rx for macrobid given to take until her surgery - Urinalysis, Routine w reflex microscopic   No follow-ups on file.  Nicolette Bang, MD  [facility] [place]

## 2021-03-10 ENCOUNTER — Encounter (HOSPITAL_COMMUNITY): Payer: Self-pay

## 2021-03-10 ENCOUNTER — Other Ambulatory Visit (HOSPITAL_COMMUNITY)
Admission: RE | Admit: 2021-03-10 | Discharge: 2021-03-10 | Disposition: A | Discharge status: Home / Self Care | Payer: Medicare Other | Source: Ambulatory Visit | Attending: Urology | Admitting: Urology

## 2021-03-10 ENCOUNTER — Other Ambulatory Visit: Payer: Self-pay

## 2021-03-10 ENCOUNTER — Encounter (HOSPITAL_COMMUNITY)
Admission: RE | Admit: 2021-03-10 | Discharge: 2021-03-10 | Disposition: A | Discharge status: Home / Self Care | Payer: Medicare Other | Source: Ambulatory Visit | Attending: Urology | Admitting: Urology

## 2021-03-10 DIAGNOSIS — N2889 Other specified disorders of kidney and ureter: Secondary | ICD-10-CM | POA: Diagnosis not present

## 2021-03-10 DIAGNOSIS — S3710XA Unspecified injury of ureter, initial encounter: Secondary | ICD-10-CM | POA: Diagnosis present

## 2021-03-10 DIAGNOSIS — Z7982 Long term (current) use of aspirin: Secondary | ICD-10-CM | POA: Diagnosis not present

## 2021-03-10 DIAGNOSIS — K660 Peritoneal adhesions (postprocedural) (postinfection): Secondary | ICD-10-CM | POA: Diagnosis not present

## 2021-03-10 DIAGNOSIS — D72829 Elevated white blood cell count, unspecified: Secondary | ICD-10-CM | POA: Insufficient documentation

## 2021-03-10 DIAGNOSIS — Z20822 Contact with and (suspected) exposure to covid-19: Secondary | ICD-10-CM | POA: Diagnosis not present

## 2021-03-10 DIAGNOSIS — Z79899 Other long term (current) drug therapy: Secondary | ICD-10-CM | POA: Diagnosis not present

## 2021-03-10 DIAGNOSIS — Z01812 Encounter for preprocedural laboratory examination: Secondary | ICD-10-CM | POA: Insufficient documentation

## 2021-03-10 DIAGNOSIS — E7800 Pure hypercholesterolemia, unspecified: Secondary | ICD-10-CM | POA: Diagnosis not present

## 2021-03-10 DIAGNOSIS — N135 Crossing vessel and stricture of ureter without hydronephrosis: Secondary | ICD-10-CM | POA: Diagnosis not present

## 2021-03-10 DIAGNOSIS — N9989 Other postprocedural complications and disorders of genitourinary system: Secondary | ICD-10-CM | POA: Diagnosis not present

## 2021-03-10 DIAGNOSIS — I10 Essential (primary) hypertension: Secondary | ICD-10-CM | POA: Diagnosis not present

## 2021-03-10 DIAGNOSIS — Z881 Allergy status to other antibiotic agents status: Secondary | ICD-10-CM | POA: Diagnosis not present

## 2021-03-10 HISTORY — DX: Personal history of urinary calculi: Z87.442

## 2021-03-10 LAB — CBC WITH DIFFERENTIAL/PLATELET
Abs Immature Granulocytes: 0.01 10*3/uL (ref 0.00–0.07)
Basophils Absolute: 0 10*3/uL (ref 0.0–0.1)
Basophils Relative: 1 %
Eosinophils Absolute: 0.1 10*3/uL (ref 0.0–0.5)
Eosinophils Relative: 2 %
HCT: 39.5 % (ref 36.0–46.0)
Hemoglobin: 12.6 g/dL (ref 12.0–15.0)
Immature Granulocytes: 0 %
Lymphocytes Relative: 25 %
Lymphs Abs: 1.5 10*3/uL (ref 0.7–4.0)
MCH: 29.8 pg (ref 26.0–34.0)
MCHC: 31.9 g/dL (ref 30.0–36.0)
MCV: 93.4 fL (ref 80.0–100.0)
Monocytes Absolute: 0.6 10*3/uL (ref 0.1–1.0)
Monocytes Relative: 10 %
Neutro Abs: 3.8 10*3/uL (ref 1.7–7.7)
Neutrophils Relative %: 62 %
Platelets: 239 10*3/uL (ref 150–400)
RBC: 4.23 MIL/uL (ref 3.87–5.11)
RDW: 12.9 % (ref 11.5–15.5)
WBC: 6.1 10*3/uL (ref 4.0–10.5)
nRBC: 0 % (ref 0.0–0.2)

## 2021-03-10 LAB — BASIC METABOLIC PANEL
Anion gap: 7 (ref 5–15)
BUN: 14 mg/dL (ref 8–23)
CO2: 28 mmol/L (ref 22–32)
Calcium: 9.4 mg/dL (ref 8.9–10.3)
Chloride: 103 mmol/L (ref 98–111)
Creatinine, Ser: 1.11 mg/dL — ABNORMAL HIGH (ref 0.44–1.00)
GFR, Estimated: 52 mL/min — ABNORMAL LOW (ref >60)
Glucose, Bld: 109 mg/dL — ABNORMAL HIGH (ref 70–99)
Potassium: 4.1 mmol/L (ref 3.5–5.1)
Sodium: 138 mmol/L (ref 135–145)

## 2021-03-10 LAB — SARS CORONAVIRUS 2 (TAT 6-24 HRS): SARS Coronavirus 2: NEGATIVE

## 2021-03-12 ENCOUNTER — Inpatient Hospital Stay (HOSPITAL_COMMUNITY)
Admission: RE | Admit: 2021-03-12 | Discharge: 2021-03-14 | DRG: 661 | Disposition: A | Discharge status: Home / Self Care | Payer: Medicare Other | Source: Ambulatory Visit | Attending: Urology | Admitting: Urology

## 2021-03-12 ENCOUNTER — Inpatient Hospital Stay (HOSPITAL_COMMUNITY): Payer: Medicare Other

## 2021-03-12 ENCOUNTER — Other Ambulatory Visit: Payer: Self-pay

## 2021-03-12 ENCOUNTER — Inpatient Hospital Stay (HOSPITAL_COMMUNITY): Payer: Medicare Other | Admitting: Certified Registered Nurse Anesthetist

## 2021-03-12 ENCOUNTER — Encounter (HOSPITAL_COMMUNITY)
Admission: RE | Disposition: A | Discharge status: Home / Self Care | Payer: Self-pay | Source: Ambulatory Visit | Attending: Urology

## 2021-03-12 ENCOUNTER — Encounter (HOSPITAL_COMMUNITY): Payer: Self-pay | Admitting: Urology

## 2021-03-12 DIAGNOSIS — K660 Peritoneal adhesions (postprocedural) (postinfection): Secondary | ICD-10-CM | POA: Diagnosis present

## 2021-03-12 DIAGNOSIS — E7800 Pure hypercholesterolemia, unspecified: Secondary | ICD-10-CM | POA: Diagnosis present

## 2021-03-12 DIAGNOSIS — N135 Crossing vessel and stricture of ureter without hydronephrosis: Secondary | ICD-10-CM | POA: Diagnosis present

## 2021-03-12 DIAGNOSIS — Z881 Allergy status to other antibiotic agents status: Secondary | ICD-10-CM

## 2021-03-12 DIAGNOSIS — Z79899 Other long term (current) drug therapy: Secondary | ICD-10-CM

## 2021-03-12 DIAGNOSIS — I10 Essential (primary) hypertension: Secondary | ICD-10-CM | POA: Diagnosis present

## 2021-03-12 DIAGNOSIS — Z20822 Contact with and (suspected) exposure to covid-19: Secondary | ICD-10-CM | POA: Diagnosis present

## 2021-03-12 DIAGNOSIS — Z7982 Long term (current) use of aspirin: Secondary | ICD-10-CM | POA: Diagnosis not present

## 2021-03-12 DIAGNOSIS — N9989 Other postprocedural complications and disorders of genitourinary system: Secondary | ICD-10-CM | POA: Diagnosis not present

## 2021-03-12 DIAGNOSIS — S3710XA Unspecified injury of ureter, initial encounter: Secondary | ICD-10-CM | POA: Diagnosis present

## 2021-03-12 HISTORY — PX: CYSTOSCOPY: SHX5120

## 2021-03-12 HISTORY — PX: URETERAL REIMPLANTION: SHX2611

## 2021-03-12 SURGERY — REIMPLANTATION, URETER
Anesthesia: General | Site: Ureter | Laterality: Right

## 2021-03-12 MED ORDER — HYDROMORPHONE HCL 1 MG/ML IJ SOLN
0.2500 mg | INTRAMUSCULAR | Status: DC | PRN
  Administered 2021-03-12: 0.5 mg via INTRAVENOUS
  Filled 2021-03-12: qty 0.5

## 2021-03-12 MED ORDER — PROPOFOL 10 MG/ML IV BOLUS
INTRAVENOUS | Status: AC
  Filled 2021-03-12: qty 20

## 2021-03-12 MED ORDER — MIDAZOLAM HCL 5 MG/5ML IJ SOLN
INTRAMUSCULAR | Status: DC | PRN
  Administered 2021-03-12: 2 mg via INTRAVENOUS

## 2021-03-12 MED ORDER — LOSARTAN POTASSIUM 50 MG PO TABS
100.0000 mg | ORAL_TABLET | Freq: Every day | ORAL | Status: DC
  Administered 2021-03-13 – 2021-03-14 (×2): 100 mg via ORAL
  Filled 2021-03-12 (×3): qty 2

## 2021-03-12 MED ORDER — ROCURONIUM BROMIDE 10 MG/ML (PF) SYRINGE
PREFILLED_SYRINGE | INTRAVENOUS | Status: AC
  Filled 2021-03-12: qty 10

## 2021-03-12 MED ORDER — OXYCODONE-ACETAMINOPHEN 5-325 MG PO TABS
1.0000 | ORAL_TABLET | ORAL | Status: DC | PRN
  Administered 2021-03-13: 1 via ORAL
  Filled 2021-03-12: qty 1

## 2021-03-12 MED ORDER — CEFAZOLIN SODIUM-DEXTROSE 2-4 GM/100ML-% IV SOLN
2.0000 g | INTRAVENOUS | Status: AC
Start: 2021-03-12 — End: 2021-03-12
  Administered 2021-03-12: 2 g via INTRAVENOUS
  Filled 2021-03-12: qty 100

## 2021-03-12 MED ORDER — LIDOCAINE HCL (PF) 2 % IJ SOLN
INTRAMUSCULAR | Status: AC
  Filled 2021-03-12: qty 5

## 2021-03-12 MED ORDER — HYDROMORPHONE HCL 1 MG/ML IJ SOLN
0.5000 mg | INTRAMUSCULAR | Status: DC | PRN
Start: 2021-03-12 — End: 2021-03-14

## 2021-03-12 MED ORDER — EPHEDRINE SULFATE 50 MG/ML IJ SOLN
INTRAMUSCULAR | Status: DC | PRN
  Administered 2021-03-12: 5 mg via INTRAVENOUS
  Administered 2021-03-12: 10 mg via INTRAVENOUS

## 2021-03-12 MED ORDER — HYDROCHLOROTHIAZIDE 12.5 MG PO CAPS
12.5000 mg | ORAL_CAPSULE | Freq: Every day | ORAL | Status: DC
  Administered 2021-03-12 – 2021-03-14 (×3): 12.5 mg via ORAL
  Filled 2021-03-12 (×4): qty 1

## 2021-03-12 MED ORDER — SUGAMMADEX SODIUM 200 MG/2ML IV SOLN
INTRAVENOUS | Status: DC | PRN
  Administered 2021-03-12: 200 mg via INTRAVENOUS

## 2021-03-12 MED ORDER — DIPHENHYDRAMINE HCL 12.5 MG/5ML PO ELIX: 12.5000 mg | ORAL_SOLUTION | Freq: Four times a day (QID) | ORAL | Status: DC | PRN

## 2021-03-12 MED ORDER — LIDOCAINE HCL (CARDIAC) PF 100 MG/5ML IV SOSY
PREFILLED_SYRINGE | INTRAVENOUS | Status: DC | PRN
  Administered 2021-03-12: 100 mg via INTRAVENOUS

## 2021-03-12 MED ORDER — ASPIRIN EC 81 MG PO TBEC
81.0000 mg | DELAYED_RELEASE_TABLET | Freq: Every day | ORAL | Status: DC
  Administered 2021-03-12 – 2021-03-14 (×3): 81 mg via ORAL
  Filled 2021-03-12 (×3): qty 1

## 2021-03-12 MED ORDER — ONDANSETRON HCL 4 MG/2ML IJ SOLN
INTRAMUSCULAR | Status: DC | PRN
  Administered 2021-03-12: 4 mg via INTRAVENOUS

## 2021-03-12 MED ORDER — ROSUVASTATIN CALCIUM 10 MG PO TABS
10.0000 mg | ORAL_TABLET | Freq: Every day | ORAL | Status: DC
  Administered 2021-03-12 – 2021-03-13 (×2): 10 mg via ORAL
  Filled 2021-03-12 (×2): qty 1

## 2021-03-12 MED ORDER — ONDANSETRON HCL 4 MG/2ML IJ SOLN: 4.0000 mg | Freq: Once | INTRAMUSCULAR | Status: DC | PRN

## 2021-03-12 MED ORDER — FENTANYL CITRATE (PF) 100 MCG/2ML IJ SOLN
INTRAMUSCULAR | Status: AC
  Filled 2021-03-12: qty 2

## 2021-03-12 MED ORDER — ACETAMINOPHEN 325 MG PO TABS: 650.0000 mg | ORAL_TABLET | ORAL | Status: DC | PRN

## 2021-03-12 MED ORDER — HYOSCYAMINE SULFATE 0.125 MG SL SUBL: 0.1250 mg | SUBLINGUAL_TABLET | SUBLINGUAL | Status: DC | PRN

## 2021-03-12 MED ORDER — SUCCINYLCHOLINE CHLORIDE 20 MG/ML IJ SOLN
INTRAMUSCULAR | Status: DC | PRN
  Administered 2021-03-12: 120 mg via INTRAVENOUS

## 2021-03-12 MED ORDER — LACTATED RINGERS IV SOLN: INTRAVENOUS | Status: DC

## 2021-03-12 MED ORDER — PROPOFOL 10 MG/ML IV BOLUS
INTRAVENOUS | Status: DC | PRN
  Administered 2021-03-12 (×2): 50 mg via INTRAVENOUS
  Administered 2021-03-12: 160 mg via INTRAVENOUS

## 2021-03-12 MED ORDER — SODIUM CHLORIDE 0.9 % IR SOLN
Status: DC | PRN
  Administered 2021-03-12: 3000 mL

## 2021-03-12 MED ORDER — SUCCINYLCHOLINE CHLORIDE 200 MG/10ML IV SOSY
PREFILLED_SYRINGE | INTRAVENOUS | Status: AC
  Filled 2021-03-12: qty 10

## 2021-03-12 MED ORDER — SODIUM CHLORIDE 0.9 % IV SOLN
INTRAVENOUS | Status: DC | PRN
  Administered 2021-03-12 (×3): 100 ug via INTRAVENOUS

## 2021-03-12 MED ORDER — ZOLPIDEM TARTRATE 5 MG PO TABS: 5.0000 mg | ORAL_TABLET | Freq: Every evening | ORAL | Status: DC | PRN

## 2021-03-12 MED ORDER — 0.9 % SODIUM CHLORIDE (POUR BTL) OPTIME
TOPICAL | Status: DC | PRN
  Administered 2021-03-12 (×2): 1000 mL

## 2021-03-12 MED ORDER — ORAL CARE MOUTH RINSE: 15.0000 mL | Freq: Once | OROMUCOSAL | Status: AC

## 2021-03-12 MED ORDER — ONDANSETRON HCL 4 MG/2ML IJ SOLN: 4.0000 mg | INTRAMUSCULAR | Status: DC | PRN

## 2021-03-12 MED ORDER — KETAMINE HCL 10 MG/ML IJ SOLN
INTRAMUSCULAR | Status: DC | PRN
  Administered 2021-03-12 (×2): 10 mg via INTRAVENOUS

## 2021-03-12 MED ORDER — DEXAMETHASONE SODIUM PHOSPHATE 4 MG/ML IJ SOLN
INTRAMUSCULAR | Status: DC | PRN
  Administered 2021-03-12: 8 mg via INTRAVENOUS

## 2021-03-12 MED ORDER — STERILE WATER FOR IRRIGATION IR SOLN
Status: DC | PRN
  Administered 2021-03-12: 500 mL

## 2021-03-12 MED ORDER — SODIUM CHLORIDE 0.9 % IV SOLN: INTRAVENOUS | Status: DC

## 2021-03-12 MED ORDER — BUPIVACAINE-EPINEPHRINE 0.5% -1:200000 IJ SOLN
INTRAMUSCULAR | Status: DC | PRN
  Administered 2021-03-12: 20 mL

## 2021-03-12 MED ORDER — KETAMINE HCL 50 MG/5ML IJ SOSY
PREFILLED_SYRINGE | INTRAMUSCULAR | Status: AC
  Filled 2021-03-12: qty 5

## 2021-03-12 MED ORDER — DIPHENHYDRAMINE HCL 50 MG/ML IJ SOLN: 12.5000 mg | Freq: Four times a day (QID) | INTRAMUSCULAR | Status: DC | PRN

## 2021-03-12 MED ORDER — ROCURONIUM BROMIDE 100 MG/10ML IV SOLN
INTRAVENOUS | Status: DC | PRN
  Administered 2021-03-12: 20 mg via INTRAVENOUS
  Administered 2021-03-12: 60 mg via INTRAVENOUS
  Administered 2021-03-12: 20 mg via INTRAVENOUS

## 2021-03-12 MED ORDER — PHENYLEPHRINE 40 MCG/ML (10ML) SYRINGE FOR IV PUSH (FOR BLOOD PRESSURE SUPPORT)
PREFILLED_SYRINGE | INTRAVENOUS | Status: AC
  Filled 2021-03-12: qty 10

## 2021-03-12 MED ORDER — FENTANYL CITRATE (PF) 100 MCG/2ML IJ SOLN
INTRAMUSCULAR | Status: DC | PRN
  Administered 2021-03-12 (×5): 50 ug via INTRAVENOUS
  Administered 2021-03-12: 100 ug via INTRAVENOUS

## 2021-03-12 MED ORDER — FENTANYL CITRATE (PF) 250 MCG/5ML IJ SOLN
INTRAMUSCULAR | Status: AC
  Filled 2021-03-12: qty 5

## 2021-03-12 MED ORDER — OXYBUTYNIN CHLORIDE ER 5 MG PO TB24
10.0000 mg | ORAL_TABLET | Freq: Every day | ORAL | Status: DC
  Administered 2021-03-12 – 2021-03-14 (×3): 10 mg via ORAL
  Filled 2021-03-12 (×3): qty 2

## 2021-03-12 MED ORDER — BUPIVACAINE-EPINEPHRINE (PF) 0.5% -1:200000 IJ SOLN
INTRAMUSCULAR | Status: AC
  Filled 2021-03-12: qty 30

## 2021-03-12 MED ORDER — DEXAMETHASONE SODIUM PHOSPHATE 10 MG/ML IJ SOLN
INTRAMUSCULAR | Status: AC
  Filled 2021-03-12: qty 1

## 2021-03-12 MED ORDER — CEFAZOLIN SODIUM-DEXTROSE 1-4 GM/50ML-% IV SOLN
1.0000 g | Freq: Three times a day (TID) | INTRAVENOUS | Status: DC
  Administered 2021-03-12 – 2021-03-14 (×6): 1 g via INTRAVENOUS
  Filled 2021-03-12 (×12): qty 50

## 2021-03-12 MED ORDER — CHLORHEXIDINE GLUCONATE 0.12 % MT SOLN
15.0000 mL | Freq: Once | OROMUCOSAL | Status: AC
  Administered 2021-03-12: 15 mL via OROMUCOSAL

## 2021-03-12 MED ORDER — EPHEDRINE 5 MG/ML INJ
INTRAVENOUS | Status: AC
  Filled 2021-03-12: qty 10

## 2021-03-12 MED ORDER — ONDANSETRON HCL 4 MG/2ML IJ SOLN
INTRAMUSCULAR | Status: AC
  Filled 2021-03-12: qty 2

## 2021-03-12 MED ORDER — MIDAZOLAM HCL 2 MG/2ML IJ SOLN
INTRAMUSCULAR | Status: AC
  Filled 2021-03-12: qty 2

## 2021-03-12 SURGICAL SUPPLY — 54 items
APL PRP STRL LF DISP 70% ISPRP (MISCELLANEOUS) ×2
APPLIER CLIP 13 LRG OPEN (CLIP) ×4
APR CLP LRG 13 20 CLIP (CLIP) ×2
BAG DRAIN CYSTO-URO STER (DRAIN) ×2 IMPLANT
CHLORAPREP W/TINT 26 (MISCELLANEOUS) ×4 IMPLANT
CLIP APPLIE 13 LRG OPEN (CLIP) IMPLANT
COVER SURGICAL LIGHT HANDLE (MISCELLANEOUS) ×8 IMPLANT
COVER WAND RF STERILE (DRAPES) ×6 IMPLANT
DECANTER SPIKE VIAL GLASS SM (MISCELLANEOUS) ×4 IMPLANT
DRAPE WARM FLUID 44X44 (DRAPES) ×4 IMPLANT
DRSG OPSITE POSTOP 4X10 (GAUZE/BANDAGES/DRESSINGS) ×4 IMPLANT
ELECT BLADE 6 FLAT ULTRCLN (ELECTRODE) ×4 IMPLANT
ELECT REM PT RETURN 9FT ADLT (ELECTROSURGICAL) ×4
ELECTRODE REM PT RTRN 9FT ADLT (ELECTROSURGICAL) ×2 IMPLANT
EVACUATOR DRAINAGE 10X20 100CC (DRAIN) ×2 IMPLANT
EVACUATOR SILICONE 100CC (DRAIN) ×4
GAUZE SPONGE 4X4 12PLY STRL LF (GAUZE/BANDAGES/DRESSINGS) ×4 IMPLANT
GLOVE BIO SURGEON STRL SZ8 (GLOVE) ×8 IMPLANT
GLOVE SRG 8 PF TXTR STRL LF DI (GLOVE) ×2 IMPLANT
GLOVE SURG LTX SZ6.5 (GLOVE) ×2 IMPLANT
GLOVE SURG LTX SZ7 (GLOVE) ×2 IMPLANT
GLOVE SURG UNDER POLY LF SZ6.5 (GLOVE) ×2 IMPLANT
GLOVE SURG UNDER POLY LF SZ7 (GLOVE) ×18 IMPLANT
GLOVE SURG UNDER POLY LF SZ8 (GLOVE) ×12
GOWN STRL REUS W/TWL LRG LVL3 (GOWN DISPOSABLE) ×10 IMPLANT
GOWN STRL REUS W/TWL XL LVL3 (GOWN DISPOSABLE) ×8 IMPLANT
GUIDEWIRE STR ZIPWIRE 035X150 (MISCELLANEOUS) ×2 IMPLANT
INST SET MAJOR GENERAL (KITS) ×4 IMPLANT
IV NS IRRIG 3000ML ARTHROMATIC (IV SOLUTION) ×2 IMPLANT
KIT TURNOVER KIT A (KITS) ×4 IMPLANT
NDL HYPO 21X1.5 SAFETY (NEEDLE) ×2 IMPLANT
NEEDLE HYPO 21X1.5 SAFETY (NEEDLE) ×4 IMPLANT
NS IRRIG 1000ML POUR BTL (IV SOLUTION) ×8 IMPLANT
PACK CYSTO (CUSTOM PROCEDURE TRAY) ×2 IMPLANT
PACK MAJOR ABDOMINAL (CUSTOM PROCEDURE TRAY) ×4 IMPLANT
PAD ARMBOARD 7.5X6 YLW CONV (MISCELLANEOUS) ×4 IMPLANT
SET BASIN LINEN APH (SET/KITS/TRAYS/PACK) ×4 IMPLANT
SPONGE DRAIN TRACH 4X4 STRL 2S (GAUZE/BANDAGES/DRESSINGS) ×2 IMPLANT
SPONGE INTESTINAL PEANUT (DISPOSABLE) ×2 IMPLANT
SPONGE LAP 18X18 RF (DISPOSABLE) ×10 IMPLANT
STAPLER VISISTAT 35W (STAPLE) ×4 IMPLANT
STENT URET 6FRX26 CONTOUR (STENTS) ×2 IMPLANT
SUT ETHILON 3 0 FSL (SUTURE) ×4 IMPLANT
SUT MON AB 4-0 SH 27 (SUTURE) ×4 IMPLANT
SUT PDS AB 0 CTX 60 (SUTURE) ×4 IMPLANT
SUT VIC AB 0 CT1 27 (SUTURE) ×4
SUT VIC AB 0 CT1 27XBRD ANTBC (SUTURE) ×2 IMPLANT
SUT VIC AB 2-0 CT2 27 (SUTURE) ×2 IMPLANT
SYR TOOMEY IRRIG 70ML (MISCELLANEOUS) ×4
SYRINGE TOOMEY IRRIG 70ML (MISCELLANEOUS) IMPLANT
TOWEL OR 17X26 4PK STRL BLUE (TOWEL DISPOSABLE) ×4 IMPLANT
TRAY FOLEY W/BAG SLVR 16FR (SET/KITS/TRAYS/PACK) ×8
TRAY FOLEY W/BAG SLVR 16FR ST (SET/KITS/TRAYS/PACK) IMPLANT
WATER STERILE IRR 500ML POUR (IV SOLUTION) ×4 IMPLANT

## 2021-03-12 NOTE — Op Note (Addendum)
Preoperative diagnosis: Right mid ureteral stricture  Postop diagnosis: Same  Procedure: 1.  right open ureteral neocystotomy 2.  Lysis of adhesions, extensive 3. Right 6x26 JJ ureteral stent placement 4. Cystoscopy 5. Intraoperative fluoroscopy, under 1 hour, with interpretation  Attending: Thayer Headings  Anesthesia: General  Estimated blood loss: 100 cc  Drains: 16 French Foley catheter, right 6x26 JJ ureteral stent, Jp drain right lower quadrant  Specimens: none  Antibiotics: ancef  Findings: dense adhesions of omentum and small bowel to the anterior abdominal wall. Dense scarring of mid ureter at the level of the right ovary.   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   After discussing treatment options patient decided to proceed with right open ureteral reimplant  Procedure in detail: Prior to procedure consent was obtained. Patient was brought to the operating room and briefing was done sure correct patient, correct procedure, correct site.  General anesthesia was in administered patient was placed in the supine position. Their abdomen and flank was then prepped and draped usual sterile fashion. A supraumbilical incision was made and we dissected down to the rectus muscle. The muscel was split at the midline and using sharp dissection we incised the fascia. We then entered the peritoneal cavity.  We then started this dissection by removing a extensive amount of anterior abdominal wall adhesions likely from her previous hysterectomy   We then reflected the small bowel and colon medially.  We then identified the psoas muscle and the iliac vessels.  Once this was done we traced it down to the iliac vessels and identified the ureter.  We the dissected the ureter down to the pelvis an noted the ureter was scarred tat the level of the mid ureter. We then clipped the ureter at this point and then  sharply cut it to free from the adhesions We then proceeded to dissect the anterior bladder wall and freed the bladder from its lateral and superior attachements. We then filled the bladder with 250cc of water. The ureter was able to be draped over the bladder without tension. We then draped the right ureter over the dome of the bladder to approximate where the incision needed to be made int he bladder. We then sharply incised the bladder at the dome. We then spatulated the ureter. The ureter was then attached to the bladder with 4-0 monocryls in a running fashion. Prior to finishing the anastomosis we advanced a zipwire up the ureter and to the renal pelvis. We then advanced a 6x26 JJ ureteral stent up to the renal pelvis. The wire was then removed. We then placed the distal curl of the stent into the bladder and then proceeded to finish the anastomosis. The anastomosis was then tested and no leak was seen. We then closed the bladder in a second layer with a running 0 vicryl. We then placed a Blake drain through a separate 0.5cm incision in the right lower quadrant. It was secured to the skin with a 2-0 nylon suture. We then closed the fascia with 0 PDS in a running fashion. The subcutaneous tissue was reapproximated with 2-0 vicryl in a running fashion. The skin was closed with staples. We then repositioned the patient in the dorsal lithotomy position.  Her genitalia was then prepped and draped int he usual sterile fashion.  A rigid 33 French cystoscope was passed in the urethra and the bladder.  Bladder was inspected free masses or lesions.  We removed the nephrostomy tube and then using a grasper repositioned the stent so the curl was in the renal pelvis. A 16 French catheter was then placed and this then concluded the procedure which was well tolerated by the patient.  Complications: None  Condition: Stable, x-rayed, transferred to PACU.  Plan: Patient is to be admitted for inpatient stay. The foley  catheter will be removed in 5 days. Stent removed in 6 weeks. They will be started on a clear liquid diet POD#1  .

## 2021-03-12 NOTE — Anesthesia Postprocedure Evaluation (Signed)
Anesthesia Post Note  Patient: Amanda Case  Procedure(s) Performed: URETERAL REIMPLANT (Right: Ureter) CYSTOSCOPY WITH FLUOROSCOPY (Bladder)  Patient location during evaluation: PACU Anesthesia Type: General Level of consciousness: awake and alert and oriented Pain management: pain level controlled Vital Signs Assessment: post-procedure vital signs reviewed and stable Respiratory status: spontaneous breathing and respiratory function stable Cardiovascular status: blood pressure returned to baseline and stable Postop Assessment: no apparent nausea or vomiting Anesthetic complications: no   No notable events documented.   Last Vitals:  Vitals:   03/12/21 1130 03/12/21 1145  BP: (!) 151/71 (!) 159/72  Pulse: 85 87  Resp: 10 16  Temp:    SpO2: (!) 88% 99%    Last Pain:  Vitals:   03/12/21 1200  TempSrc:   PainSc: 0-No pain                 [name]

## 2021-03-12 NOTE — Transfer of Care (Signed)
Immediate Anesthesia Transfer of Care Note  Patient: Amanda Case  Procedure(s) Performed: URETERAL REIMPLANT (Right: Ureter)  Patient Location: PACU  Anesthesia Type:General  Level of Consciousness: drowsy, patient cooperative and responds to stimulation  Airway & Oxygen Therapy: Patient Spontanous Breathing and Patient connected to face mask oxygen  Post-op Assessment: Report given to RN, Post -op Vital signs reviewed and stable and Patient moving all extremities  Post vital signs: Reviewed and stable  Last Vitals:  Vitals Value Taken Time  BP    Temp    Pulse    Resp    SpO2      Last Pain:  Vitals:   03/12/21 0659  TempSrc: Oral  PainSc: 0-No pain      Patients Stated Pain Goal: 5 (07/86/75 4492)  Complications: No notable events documented.

## 2021-03-12 NOTE — Interval H&P Note (Signed)
History and Physical Interval Note:  03/12/2021 7:46 AM  Amanda Case  has presented today for surgery, with the diagnosis of right ureteral stricture.  The various methods of treatment have been discussed with the patient and family. After consideration of risks, benefits and other options for treatment, the patient has consented to  Procedure(s): URETERAL REIMPLANT-ASSIST-CYNTHIA (Right) as a surgical intervention.  The patient's history has been reviewed, patient examined, no change in status, stable for surgery.  I have reviewed the patient's chart and labs.  Questions were answered to the patient's satisfaction.     Nicolette Bang

## 2021-03-12 NOTE — Anesthesia Procedure Notes (Signed)
Procedure Name: Intubation Date/Time: 03/12/2021 7:58 AM Performed by: Tacy Learn, CRNA Pre-anesthesia Checklist: Patient identified, Emergency Drugs available, Suction available, Patient being monitored and Timeout performed Patient Re-evaluated:Patient Re-evaluated prior to induction Oxygen Delivery Method: Circle system utilized Preoxygenation: Pre-oxygenation with 100% oxygen Induction Type: IV induction Laryngoscope Size: Miller and 2 Grade View: Grade II Tube type: Oral Tube size: 7.0 mm Number of attempts: 1 Airway Equipment and Method: Stylet Placement Confirmation: ETT inserted through vocal cords under direct vision, positive ETCO2, CO2 detector and breath sounds checked- equal and bilateral Secured at: 21 cm Tube secured with: Tape Dental Injury: Teeth and Oropharynx as per pre-operative assessment

## 2021-03-12 NOTE — Anesthesia Preprocedure Evaluation (Signed)
Anesthesia Evaluation  Patient identified by MRN, date of birth, ID band Patient awake    Reviewed: Allergy & Precautions, NPO status , Patient's Chart, lab work & pertinent test results  Airway Mallampati: II  TM Distance: >3 FB Neck ROM: Full    Dental  (+) Dental Advisory Given, Teeth Intact, Caps   Pulmonary neg pulmonary ROS,    Pulmonary exam normal breath sounds clear to auscultation       Cardiovascular Exercise Tolerance: Good hypertension, Pt. on medications Normal cardiovascular exam Rhythm:Regular Rate:Normal     Neuro/Psych negative neurological ROS  negative psych ROS   GI/Hepatic negative GI ROS, Neg liver ROS,   Endo/Other  negative endocrine ROS  Renal/GU Renal InsufficiencyRenal disease  negative genitourinary   Musculoskeletal   Abdominal   Peds negative pediatric ROS (+)  Hematology negative hematology ROS (+)   Anesthesia Other Findings   Reproductive/Obstetrics                            Anesthesia Physical  Anesthesia Plan  ASA: 2  Anesthesia Plan: General   Post-op Pain Management:    Induction: Intravenous  PONV Risk Score and Plan: 4 or greater and Ondansetron and Dexamethasone  Airway Management Planned: Oral ETT  Additional Equipment:   Intra-op Plan:   Post-operative Plan: Extubation in OR  Informed Consent: I have reviewed the patients History and Physical, chart, labs and discussed the procedure including the risks, benefits and alternatives for the proposed anesthesia with the patient or authorized representative who has indicated his/her understanding and acceptance.     Dental advisory given  Plan Discussed with: CRNA and Surgeon  Anesthesia Plan Comments:         Anesthesia Quick Evaluation

## 2021-03-13 ENCOUNTER — Encounter (HOSPITAL_COMMUNITY): Payer: Self-pay | Admitting: Urology

## 2021-03-13 LAB — CBC
HCT: 34.7 % — ABNORMAL LOW (ref 36.0–46.0)
Hemoglobin: 11.1 g/dL — ABNORMAL LOW (ref 12.0–15.0)
MCH: 30.1 pg (ref 26.0–34.0)
MCHC: 32 g/dL (ref 30.0–36.0)
MCV: 94 fL (ref 80.0–100.0)
Platelets: 241 10*3/uL (ref 150–400)
RBC: 3.69 MIL/uL — ABNORMAL LOW (ref 3.87–5.11)
RDW: 13 % (ref 11.5–15.5)
WBC: 12 10*3/uL — ABNORMAL HIGH (ref 4.0–10.5)
nRBC: 0 % (ref 0.0–0.2)

## 2021-03-13 LAB — BASIC METABOLIC PANEL
Anion gap: 6 (ref 5–15)
BUN: 12 mg/dL (ref 8–23)
CO2: 29 mmol/L (ref 22–32)
Calcium: 9.1 mg/dL (ref 8.9–10.3)
Chloride: 103 mmol/L (ref 98–111)
Creatinine, Ser: 1.15 mg/dL — ABNORMAL HIGH (ref 0.44–1.00)
GFR, Estimated: 50 mL/min — ABNORMAL LOW (ref >60)
Glucose, Bld: 130 mg/dL — ABNORMAL HIGH (ref 70–99)
Potassium: 4.6 mmol/L (ref 3.5–5.1)
Sodium: 138 mmol/L (ref 135–145)

## 2021-03-13 LAB — SURGICAL PATHOLOGY

## 2021-03-13 MED ORDER — CHLORHEXIDINE GLUCONATE CLOTH 2 % EX PADS
6.0000 | MEDICATED_PAD | Freq: Every day | CUTANEOUS | Status: DC
  Administered 2021-03-13 – 2021-03-14 (×2): 6 via TOPICAL

## 2021-03-13 MED ORDER — OXYCODONE-ACETAMINOPHEN 5-325 MG PO TABS: 1.0000 | ORAL_TABLET | ORAL | 0 refills | Status: DC | PRN

## 2021-03-13 NOTE — Progress Notes (Signed)
1 Day Post-Op Subjective: Patient reports incisional pain, tolerating PO, and pain control good.  Objective: Vital signs in last 24 hours: Temp:  [98.1 F (36.7 C)-98.6 F (37 C)] 98.1 F (36.7 C) (06/24 1411) Pulse Rate:  [89-107] 96 (06/24 1411) Resp:  [18-20] 20 (06/24 0440) BP: (137-148)/(63-94) 138/63 (06/24 1411) SpO2:  [98 %-100 %] 99 % (06/24 1411)  Intake/Output from previous day: 06/23 0701 - 06/24 0700 In: 3340 [P.O.:840; I.V.:2300; IV Piggyback:200] Out: 4820 [Urine:4725; Drains:45; Blood:50] Intake/Output this shift: Total I/O In: 2170.6 [P.O.:680; I.V.:1440.6; IV Piggyback:50] Out: 1500 [Urine:1500]  Physical Exam:  General:alert, cooperative, and appears stated age GI: soft, non tender, normal bowel sounds, no palpable masses, no organomegaly, no inguinal hernia Female genitalia: not done Extremities: extremities normal, atraumatic, no cyanosis or edema  Lab Results: Recent Labs    03/13/21 0614  HGB 11.1*  HCT 34.7*   BMET Recent Labs    03/13/21 0614  NA 138  K 4.6  CL 103  CO2 29  GLUCOSE 130*  BUN 12  CREATININE 1.15*  CALCIUM 9.1   No results for input(s): LABPT, INR in the last 72 hours. No results for input(s): LABURIN in the last 72 hours. Results for orders placed or performed during the hospital encounter of 03/10/21  SARS CORONAVIRUS 2 (TAT 6-24 HRS) Nasopharyngeal Nasopharyngeal Swab     Status: None   Collection Time: 03/10/21  9:04 AM   Specimen: Nasopharyngeal Swab  Result Value Ref Range Status   SARS Coronavirus 2 NEGATIVE NEGATIVE Final    Comment: (NOTE) SARS-CoV-2 target nucleic acids are NOT DETECTED.  The SARS-CoV-2 RNA is generally detectable in upper and lower respiratory specimens during the acute phase of infection. Negative results do not preclude SARS-CoV-2 infection, do not rule out co-infections with other pathogens, and should not be used as the sole basis for treatment or other patient management  decisions. Negative results must be combined with clinical observations, patient history, and epidemiological information. The expected result is Negative.  Fact Sheet for Patients: SugarRoll.be  Fact Sheet for Healthcare Providers: https://www.woods-mathews.com/  This test is not yet approved or cleared by the [place] FDA and  has been authorized for detection and/or diagnosis of SARS-CoV-2 by FDA under an Emergency Use Authorization (EUA). This EUA will remain  in effect (meaning this test can be used) for the duration of the COVID-19 declaration under Se ction 564(b)(1) of the Act, 21 U.S.C. section 360bbb-3(b)(1), unless the authorization is terminated or revoked sooner.  Performed at [facility], [place] [address] [place], [place] [address]     Studies/Results: DG C-Arm 1-60 Min-No Report  Result Date: 03/12/2021 Fluoroscopy was utilized by the requesting physician.  No radiographic interpretation.    Assessment/Plan: POD#1 right open ureteral reimplant  Continue current pain control regiment Continue ambulation in halls with assistance Advance diet to regular diet Likely discharge home Saturday   LOS: 1 day   Nicolette Bang 03/13/2021, 6:57 PM

## 2021-03-14 NOTE — Discharge Summary (Signed)
  Date of admission: 03/12/2021  Date of discharge: 03/14/2021  Admission diagnosis: Right ureteral injury  Discharge diagnosis: Right ureteral injury   History and Physical: For full details, please see admission history and physical. Briefly, Amanda Case is a 75 y.o. year old patient with s/p right ureteral injury who presented for right ureteral reimplantation.   Hospital Course: She underwent right ureteral reimplantation on 03/12/21.  She was able to begin ambulating, her diet was advanced and she was transitioned to oral pain medication.  Her drain output was minimal and was removed on POD # 2.  She was discharged home on POD#2 in stable condition with her catheter.   Laboratory values:  Recent Labs    03/13/21 0614  HGB 11.1*  HCT 34.7*   Recent Labs    03/13/21 8832  CREATININE 1.15*    Disposition: Home  Discharge instruction: The patient was instructed to be ambulatory but told to refrain from heavy lifting, strenuous activity, or driving.   Discharge medications:  Allergies as of 03/14/2021       Reactions   Cipro [ciprofloxacin-ciproflox Hcl Er] Hives        Medication List     TAKE these medications    aspirin EC 81 MG tablet Take 81 mg by mouth daily. Swallow whole.   cholecalciferol 25 MCG (1000 UNIT) tablet Commonly known as: VITAMIN D3 Take 1,000 Units by mouth daily.   hydrochlorothiazide 12.5 MG capsule Commonly known as: MICROZIDE Take 12.5 mg by mouth daily.   losartan 100 MG tablet Commonly known as: COZAAR Take 100 mg by mouth daily.   nitrofurantoin (macrocrystal-monohydrate) 100 MG capsule Commonly known as: MACROBID Take 1 capsule (100 mg total) by mouth every 12 (twelve) hours.   oxyCODONE-acetaminophen 5-325 MG tablet Commonly known as: Percocet Take 1 tablet by mouth every 4 (four) hours as needed.   rosuvastatin 10 MG tablet Commonly known as: CRESTOR Take 10 mg by mouth at bedtime.   vitamin B-12 500 MCG  tablet Commonly known as: CYANOCOBALAMIN Take 500 mcg by mouth daily.        Followup:   Follow-up Information     McKenzie, Candee Furbish, MD. Call in 1 week(s).   Specialty: Urology Contact information: [address]  [place] [address] [phone]

## 2021-03-14 NOTE — Progress Notes (Signed)
JP drain removed from right side abdomen without complications. 4x4 gaze applied and secured. Pt tolerated well, educated on care of site until follow up appointment.

## 2021-03-14 NOTE — Progress Notes (Signed)
Nsg Discharge Note  Admit Date:  03/12/2021 Discharge date: 03/14/2021   Amanda Case to be D/C'd Home per MD order.  AVS completed.  Copy for chart, and copy for patient signed, and dated. Patient/caregiver able to verbalize understanding.  Discharge Medication: Allergies as of 03/14/2021       Reactions   Cipro [ciprofloxacin-ciproflox Hcl Er] Hives        Medication List     TAKE these medications    aspirin EC 81 MG tablet Take 81 mg by mouth daily. Swallow whole.   cholecalciferol 25 MCG (1000 UNIT) tablet Commonly known as: VITAMIN D3 Take 1,000 Units by mouth daily.   hydrochlorothiazide 12.5 MG capsule Commonly known as: MICROZIDE Take 12.5 mg by mouth daily.   losartan 100 MG tablet Commonly known as: COZAAR Take 100 mg by mouth daily.   nitrofurantoin (macrocrystal-monohydrate) 100 MG capsule Commonly known as: MACROBID Take 1 capsule (100 mg total) by mouth every 12 (twelve) hours.   oxyCODONE-acetaminophen 5-325 MG tablet Commonly known as: Percocet Take 1 tablet by mouth every 4 (four) hours as needed.   rosuvastatin 10 MG tablet Commonly known as: CRESTOR Take 10 mg by mouth at bedtime.   vitamin B-12 500 MCG tablet Commonly known as: CYANOCOBALAMIN Take 500 mcg by mouth daily.        Discharge Assessment: Vitals:   03/14/21 0500 03/14/21 1450  BP: 136/63 (!) 150/73  Pulse: 92 87  Resp: 18 16  Temp: 98.1 F (36.7 C) 98.3 F (36.8 C)  SpO2: 98% 98%   Skin clean, dry and intact without evidence of skin break down, no evidence of skin tears noted. IV catheter discontinued intact. Site without signs and symptoms of complications - no redness or edema noted at insertion site, patient denies c/o pain - only slight tenderness at site.  Dressing with slight pressure applied.  D/c Instructions-Education: Discharge instructions given to patient/family with verbalized understanding. D/c education completed with patient/family including follow  up instructions, medication list, d/c activities limitations if indicated, with other d/c instructions as indicated by MD - patient able to verbalize understanding, all questions fully answered. Patient instructed to return to ED, call 911, or call MD for any changes in condition.  Patient escorted via [place], and D/C home via private auto.  Clovis Fredrickson, LPN 0/35/0093 8:18 PM

## 2021-03-15 ENCOUNTER — Encounter (HOSPITAL_COMMUNITY): Payer: Self-pay | Admitting: Urology

## 2021-03-20 ENCOUNTER — Other Ambulatory Visit: Payer: Self-pay

## 2021-03-20 ENCOUNTER — Ambulatory Visit: Payer: Medicare Other | Admitting: Urology

## 2021-03-20 VITALS — BP 145/78 | HR 76

## 2021-03-20 DIAGNOSIS — N131 Hydronephrosis with ureteral stricture, not elsewhere classified: Secondary | ICD-10-CM

## 2021-03-20 MED ORDER — SULFAMETHOXAZOLE-TRIMETHOPRIM 800-160 MG PO TABS: 1.0000 | ORAL_TABLET | Freq: Two times a day (BID) | ORAL | 0 refills | Status: DC

## 2021-03-20 NOTE — Progress Notes (Signed)
03/20/2021 9:59 AM   Amanda Case 1945/10/30 [id number]  Referring provider: Berenice Primas [place],  [place] [address]  Followup after surgery   HPI: Amanda Case is a 75yo here for followup after right ureteral reimplant. Mild incisional pain. Urine clear from foley. No fevers. No other complaints today   PMH: Past Medical History:  Diagnosis Date   History of kidney stones    Hypercholesterolemia    Hypertension    Kidney stone     Surgical History: Past Surgical History:  Procedure Laterality Date   ABDOMINAL HYSTERECTOMY     CYSTOSCOPY N/A 03/12/2021   Procedure: CYSTOSCOPY WITH FLUOROSCOPY;  Surgeon: Cleon Gustin, MD;  Location: [facility];  Service: Urology;  Laterality: N/A;   CYSTOSCOPY WITH RETROGRADE PYELOGRAM, URETEROSCOPY AND STENT PLACEMENT Right 12/19/2020   Procedure: CYSTOSCOPY WITH RETROGRADE PYELOGRAM, URETEROSCOPY DIAGNOSTIC;  Surgeon: Cleon Gustin, MD;  Location: [facility];  Service: Urology;  Laterality: Right;   IR NEPHROSTOMY PLACEMENT RIGHT  12/23/2020   IR NEPHROSTOMY TUBE CHANGE  02/17/2021   URETERAL REIMPLANTION Right 03/12/2021   Procedure: URETERAL REIMPLANT;  Surgeon: Cleon Gustin, MD;  Location: [facility];  Service: Urology;  Laterality: Right;    Home Medications:  Allergies as of 03/20/2021       Reactions   Cipro [ciprofloxacin-ciproflox Hcl Er] Hives        Medication List        Accurate as of March 20, 2021  9:59 AM. If you have any questions, ask your nurse or doctor.          STOP taking these medications    nitrofurantoin (macrocrystal-monohydrate) 100 MG capsule Commonly known as: MACROBID Stopped by: Nicolette Bang, MD   oxyCODONE-acetaminophen 5-325 MG tablet Commonly known as: Percocet Stopped by: Nicolette Bang, MD       TAKE these medications    aspirin EC 81 MG tablet Take 81 mg by mouth daily. Swallow whole.   cholecalciferol 25 MCG (1000 UNIT) tablet Commonly known as:  VITAMIN D3 Take 1,000 Units by mouth daily.   hydrochlorothiazide 12.5 MG capsule Commonly known as: MICROZIDE Take 12.5 mg by mouth daily.   losartan 100 MG tablet Commonly known as: COZAAR Take 100 mg by mouth daily.   rosuvastatin 10 MG tablet Commonly known as: CRESTOR Take 10 mg by mouth at bedtime.   vitamin B-12 500 MCG tablet Commonly known as: CYANOCOBALAMIN Take 500 mcg by mouth daily.        Allergies:  Allergies  Allergen Reactions   Cipro [Ciprofloxacin-Ciproflox Hcl Er] Hives    Family History: No family history on file.  Social History:  reports that she has never smoked. She has never used smokeless tobacco. She reports that she does not drink alcohol and does not use drugs.  ROS: All other review of systems were reviewed and are negative except what is noted above in HPI  Physical Exam: BP (!) 145/78   Pulse 76   Constitutional:  Alert and oriented, No acute distress. HEENT: [place] AT, moist mucus membranes.  Trachea midline, no masses. Cardiovascular: No clubbing, cyanosis, or edema. Respiratory: Normal respiratory effort, no increased work of breathing. GI: Abdomen is soft, nontender, nondistended, no abdominal masses GU: No CVA tenderness.  Lymph: No cervical or inguinal lymphadenopathy. Skin: No rashes, bruises or suspicious lesions. Neurologic: Grossly intact, no focal deficits, moving all 4 extremities. Psychiatric: Normal mood and affect.  Laboratory Data: Lab Results  Component Value Date  WBC 12.0 (H) 03/13/2021   HGB 11.1 (L) 03/13/2021   HCT 34.7 (L) 03/13/2021   MCV 94.0 03/13/2021   PLT 241 03/13/2021    Lab Results  Component Value Date   CREATININE 1.15 (H) 03/13/2021    No results found for: PSA  No results found for: TESTOSTERONE  No results found for: HGBA1C  Urinalysis    Component Value Date/Time   COLORURINE YELLOW 02/18/2021 2353   APPEARANCEUR Hazy (A) 03/06/2021 1406   LABSPEC 1.010 02/18/2021 2353    PHURINE 6.0 02/18/2021 2353   GLUCOSEU Negative 03/06/2021 1406   HGBUR MODERATE (A) 02/18/2021 2353   BILIRUBINUR Negative 03/06/2021 1406   [place] 02/18/2021 2353   PROTEINUR Negative 03/06/2021 1406   PROTEINUR 100 (A) 02/18/2021 2353   UROBILINOGEN negative (A) 01/02/2020 0913   NITRITE Negative 03/06/2021 1406   NITRITE NEGATIVE 02/18/2021 2353   LEUKOCYTESUR Negative 03/06/2021 1406   LEUKOCYTESUR LARGE (A) 02/18/2021 2353    Lab Results  Component Value Date   LABMICR See below: 03/06/2021   WBCUA 0-5 03/06/2021   LABEPIT 0-10 03/06/2021   BACTERIA None seen 03/06/2021    Pertinent Imaging:  No results found for this or any previous visit.  No results found for this or any previous visit.  No results found for this or any previous visit.  No results found for this or any previous visit.  No results found for this or any previous visit.  No results found for this or any previous visit.  Results for orders placed during the hospital encounter of 12/06/19  CT HEMATURIA WORKUP  Narrative CLINICAL DATA:  Chronic UTI for 2 years. Hematuria.  EXAM: CT ABDOMEN AND PELVIS WITHOUT AND WITH CONTRAST  TECHNIQUE: Multidetector CT imaging of the abdomen and pelvis was performed following the standard protocol before and following the bolus administration of intravenous contrast.  CONTRAST:  112mL OMNIPAQUE IOHEXOL 300 MG/ML  SOLN  COMPARISON:  No prior images are available for review. Report from 2018 is noted.  FINDINGS: Lower chest: Basilar atelectasis. No effusion.  Hepatobiliary: Mildly lobular hepatic contours. No signs of focal hepatic lesion. Biliary tree is normal. Portal vein is patent.  Pancreas: Pancreas without signs of inflammation, ductal dilation or focal lesion.  Spleen: Spleen is normal size without focal lesion.  Adrenals/Urinary Tract: Adrenal glands are normal.  Renal enhancement is symmetric. No evidence of  hydronephrosis. Urinary bladder displaced by large pelvic mass. No suspicious renal lesion.  Excretory phase with limited assessment of the distal ureters. No secondary signs of stricture. The no lesion in the upper tracts.  No urinary tract calculus.  Stomach/Bowel: Small hiatal hernia. No bowel obstruction or acute bowel process. Normal appendix. Sigmoid diverticulosis.  Vascular/Lymphatic: Atheromatous changes in the abdominal aorta. No aneurysm. No adenopathy in the retroperitoneum or in the upper abdomen. No pelvic nodal enlargement. Hazy central small bowel mesentery with scattered small lymph nodes.  Reproductive: Uterus is not identifiable as a separate structure. There is a large mass in the pelvis with some subtle peripheral calcification. Lesion does not show infiltrative margins and measures approximately 15.1 x 14.7 x 11.2 cm. The uterus appears to be displaced along the left lateral margin of the mass.  The right and left ovary are likely also displaced but not well defined as discrete structures on the current CT study with engorgement of ovarian veins more so on the right than the left.  Other: No ascites.  Musculoskeletal: Spinal degenerative changes without acute or  destructive bone finding.  IMPRESSION: 1. 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 There are no infiltrative margins aside from the inability to separate discrete pelvic reproductive structures from this process. Also the absence of adenopathy is reassuring. There is an ultrasound report available from 2007 that also references a mass. Correlate with any surgeries in the interim and with follow-up pelvic imaging with ultrasound or MRI as warranted. 2. No evidence of urinary tract calculus or obstruction. No signs of upper tract lesion to explain hematuria though the mid right ureter and distal right ureter show limited  assessment due to lack of opacification. 3. Mildly lobular hepatic contours may reflect underlying cirrhosis. No focal hepatic lesion. 4. Sigmoid diverticulosis. 5. Small hiatal hernia. 6. Aortic atherosclerosis.  Aortic Atherosclerosis (ICD10-I70.0).   Electronically Signed By: Zetta Bills M.D. On: 12/07/2019 09:09  Results for orders placed during the hospital encounter of 02/18/21  CT Renal Stone Study  Narrative CLINICAL DATA:  Flank pain, kidney stone, fever, recent nephrostomy tube change  EXAM: CT ABDOMEN AND PELVIS WITHOUT CONTRAST  TECHNIQUE: Multidetector CT imaging of the abdomen and pelvis was performed following the standard protocol without IV contrast. Sagittal and coronal MPR images reconstructed from axial data set.  COMPARISON:  12/12/2020  FINDINGS: Lower chest: Subsegmental atelectasis LEFT lower lobe. RIGHT lung base clear.  Hepatobiliary: Gallbladder and liver normal appearance  Pancreas: Normal appearance  Spleen: Normal appearance  Adrenals/Urinary Tract: Adrenal glands, LEFT kidney, and LEFT ureter normal appearance. RIGHT nephrostomy tube present. No definite RIGHT renal mass or hydronephrosis. RIGHT ureter decompressed without identified urinary tract calcification. Bladder unremarkable.  Stomach/Bowel: Normal appendix. Diverticulosis of sigmoid colon without evidence of diverticulitis. Few ascending colonic diverticula. And remaining bowel loops unremarkable.  Vascular/Lymphatic: Atherosclerotic calcifications aorta without aneurysm. No adenopathy.  Reproductive: Uterus surgically absent. Nonvisualization of ovaries.  Other: No free air or free fluid. Minimal stranding in small-bowel mesentery question mint fibrosing mesenteritis unchanged. No hernia or acute inflammatory process.  Musculoskeletal: Bones demineralized with degenerative disc disease changes lumbar spine.  IMPRESSION: RIGHT nephrostomy tube without  hydronephrosis, hydroureter, or adjacent fluid collection.  Colonic diverticulosis without evidence of diverticulitis.  No acute intra-abdominal or intrapelvic abnormalities.  Aortic Atherosclerosis (ICD10-I70.0).   Electronically Signed By: Lavonia Dana M.D. On: 02/18/2021 22:00   Assessment & Plan:    1. Hydronephrosis with ureteral stricture, not elsewhere classified -half of staples removed today -RTC 1 week to remove the remainder of the staples. She will have her stent removed in 5 weeks   No follow-ups on file.  Nicolette Bang, MD  [facility] [place]

## 2021-03-20 NOTE — Progress Notes (Signed)
Removed 8 staples from abd without difficulty. Steri strips applied to site.  Fill and Pull Catheter Removal  Patient is present today for a catheter removal.  Patient was cleaned and prepped in a sterile fashion 197ml of sterile water/ saline was instilled into the bladder when the patient felt the urge to urinate. 68ml of water was then drained from the balloon.  A 16FR foley cath was removed from the bladder no complications were noted .  Patient as then given some time to void on their own.  Patient can void  224ml on their own after some time.  Patient tolerated well.  Performed by: Estill Bamberg RN  Follow up/ Additional notes: see MD notes      Urological Symptom Review  Patient is experiencing the following symptoms: none   Review of Systems  Gastrointestinal (upper)  : Negative for upper GI symptoms  Gastrointestinal (lower) : Negative for lower GI symptoms  Constitutional : Negative for symptoms  Skin: Negative for skin symptoms  Eyes: Negative for eye symptoms  Ear/Nose/Throat : Negative for Ear/Nose/Throat symptoms  Hematologic/Lymphatic: Negative for Hematologic/Lymphatic symptoms  Cardiovascular : Negative for cardiovascular symptoms  Respiratory : Negative for respiratory symptoms  Endocrine: Negative for endocrine symptoms  Musculoskeletal: Negative for musculoskeletal symptoms  Neurological: Negative for neurological symptoms  Psychologic: Negative for psychiatric symptoms

## 2021-03-24 ENCOUNTER — Encounter: Payer: Self-pay | Admitting: Urology

## 2021-03-25 DIAGNOSIS — Z79899 Other long term (current) drug therapy: Secondary | ICD-10-CM | POA: Diagnosis not present

## 2021-03-25 DIAGNOSIS — R5383 Other fatigue: Secondary | ICD-10-CM | POA: Diagnosis not present

## 2021-03-25 DIAGNOSIS — Z7189 Other specified counseling: Secondary | ICD-10-CM | POA: Diagnosis not present

## 2021-03-25 DIAGNOSIS — Z0000 Encounter for general adult medical examination without abnormal findings: Secondary | ICD-10-CM | POA: Diagnosis not present

## 2021-03-25 DIAGNOSIS — I10 Essential (primary) hypertension: Secondary | ICD-10-CM | POA: Diagnosis not present

## 2021-03-25 DIAGNOSIS — Z299 Encounter for prophylactic measures, unspecified: Secondary | ICD-10-CM | POA: Diagnosis not present

## 2021-03-25 DIAGNOSIS — E7800 Pure hypercholesterolemia, unspecified: Secondary | ICD-10-CM | POA: Diagnosis not present

## 2021-03-26 ENCOUNTER — Ambulatory Visit: Payer: Medicare Other

## 2021-03-26 ENCOUNTER — Other Ambulatory Visit: Payer: Self-pay

## 2021-03-31 ENCOUNTER — Other Ambulatory Visit: Payer: Self-pay

## 2021-03-31 ENCOUNTER — Ambulatory Visit: Payer: Medicare Other

## 2021-03-31 ENCOUNTER — Encounter: Payer: Self-pay | Admitting: Urology

## 2021-03-31 DIAGNOSIS — N131 Hydronephrosis with ureteral stricture, not elsewhere classified: Secondary | ICD-10-CM | POA: Diagnosis not present

## 2021-03-31 NOTE — Progress Notes (Signed)
Patient present today 14 staples removed and steri stripped without difficulty.  Patient will keep next scheduled OV.

## 2021-04-08 ENCOUNTER — Ambulatory Visit: Payer: Medicare Other | Admitting: Urology

## 2021-04-14 ENCOUNTER — Other Ambulatory Visit (HOSPITAL_COMMUNITY): Payer: Medicare Other

## 2021-04-27 ENCOUNTER — Ambulatory Visit: Payer: Medicare Other | Admitting: Urology

## 2021-04-27 ENCOUNTER — Encounter: Payer: Self-pay | Admitting: Urology

## 2021-04-27 ENCOUNTER — Other Ambulatory Visit: Payer: Self-pay

## 2021-04-27 VITALS — BP 152/71 | HR 93

## 2021-04-27 DIAGNOSIS — N131 Hydronephrosis with ureteral stricture, not elsewhere classified: Secondary | ICD-10-CM

## 2021-04-27 NOTE — Progress Notes (Signed)
Urological Symptom Review  Patient is experiencing the following symptoms: Get up at night to urinate Injury to kidneys/bladder   Review of Systems  Gastrointestinal (upper)  : Negative for upper GI symptoms  Gastrointestinal (lower) : Negative for lower GI symptoms  Constitutional : Negative for symptoms  Skin: Negative for skin symptoms  Eyes: Negative for eye symptoms  Ear/Nose/Throat : Negative for Ear/Nose/Throat symptoms  Hematologic/Lymphatic: Negative for Hematologic/Lymphatic symptoms  Cardiovascular : Negative for cardiovascular symptoms  Respiratory : Shortness of breath  Endocrine: Negative for endocrine symptoms  Musculoskeletal: Negative for musculoskeletal symptoms  Neurological: Negative for neurological symptoms  Psychologic: Negative for psychiatric symptoms

## 2021-04-27 NOTE — Progress Notes (Signed)
   04/27/21  CC: followup hydronephrosis   HPI: Amanda Case is a 75yo here for right ureteral stent removal Blood pressure (!) 152/71, pulse 93. NED. A&Ox3.   No respiratory distress   Abd soft, NT, ND Normal external genitalia with patent urethral meatus  Cystoscopy Procedure Note  Patient identification was confirmed, informed consent was obtained, and patient was prepped using Betadine solution.  Lidocaine jelly was administered per urethral meatus.    Procedure: - Flexible cystoscope introduced, without any difficulty.   - Thorough search of the bladder revealed:    normal urethral meatus    normal urothelium    no stones    no ulcers     no tumors    no urethral polyps    no trabeculation  - right ureteral orifice and right dome. Left ureteral orifice in normal location Using a grasper the right ureteral stent was removed intact  Post-Procedure: - Patient tolerated the procedure well  Assessment/ Plan: RTC 4-6 weeks with a renal US   No follow-ups on file.  Nicolette Bang, MD

## 2021-04-27 NOTE — Patient Instructions (Signed)
Hydronephrosis ?Hydronephrosis is the swelling of one or both kidneys due to a blockage that stops urine from flowing out of the body. Kidneys filter waste from the blood and produce urine. This condition can lead to kidney failure and may become life-threatening if not treated promptly. ?What are the causes? ?In infants and children, common causes include problems that occur when a baby is developing in the womb. These can include problems in the kidneys or in the tubes that drain urine into the bladder (ureters). ?In adults, common causes include: ?Kidney stones. ?Pregnancy. ?A tumor or cyst in the abdomen or pelvis. ?An enlarged prostate gland. ?Other causes include: ?Bladder infection. ?Scar tissue from a previous surgery or injury. ?A blood clot. ?Cancer of the prostate, bladder, uterus, ovary, or colon. ?What are the signs or symptoms? ?Symptoms of this condition include: ?Pain or discomfort in your side (flank) or abdomen. ?Swelling in your abdomen. ?Nausea and vomiting. ?Fever. ?Pain when passing urine. ?Feelings of urgency when you need to urinate. ?Urinating more often than normal. ?In some cases, you may not have any symptoms. ?How is this diagnosed? ?This condition may be diagnosed based on: ?Your symptoms and medical history. ?A physical exam. ?Blood and urine tests. ?Imaging tests, such as an ultrasound, CT scan, or MRI. ?A procedure to look at your urinary tract and bladder by inserting a scope into the urethra (cystoscopy). ?How is this treated? ?Treatment for this condition depends on where the blockage is, how long it has been there, and what caused it. The goal of treatment is to remove the blockage. Treatment may include: ?Antibiotic medicines to treat or prevent infection. ?A procedure to place a small, thin tube (stent) into a blocked ureter. The stent will keep the ureter open so that urine can drain through it. ?A nonsurgical procedure that crushes kidney stones with shock waves  (extracorporeal shock wave lithotripsy). ?If kidney failure occurs, treatment may include dialysis or a kidney transplant. ?Follow these instructions at home: ? ?Take over-the-counter and prescription medicines only as told by your health care provider. ?If you were prescribed an antibiotic medicine, take it exactly as told by your health care provider. Do not stop taking the antibiotic even if you start to feel better. ?Rest and return to your normal activities as told by your health care provider. Ask your health care provider what activities are safe for you. ?Drink enough fluid to keep your urine pale yellow. ?Keep all follow-up visits. This is important. ?Contact a health care provider if: ?You continue to have symptoms after treatment. ?You develop new symptoms. ?Your urine becomes cloudy or bloody. ?You have a fever. ?Get help right away if: ?You have severe flank or abdominal pain. ?You cannot drink fluids without vomiting. ?Summary ?Hydronephrosis is the swelling of one or both kidneys due to a blockage that stops urine from flowing out of the body. ?Hydronephrosis can lead to kidney failure and may become life-threatening if not treated promptly. ?The goal of treatment is to remove the blockage. It may include a procedure to insert a stent into a blocked ureter, a procedure to break up kidney stones, or taking antibiotic medicines. ?Follow your health care provider's instructions for taking care of yourself at home, including instructions about drinking fluids, taking medicines, and limiting activities. ?This information is not intended to replace advice given to you by your health care provider. Make sure you discuss any questions you have with your health care provider. ?Document Revised: 12/25/2019 Document Reviewed: 12/25/2019 ?Elsevier Patient   Education ? 2022 Elsevier Inc. ? ?

## 2021-05-19 ENCOUNTER — Other Ambulatory Visit: Payer: Self-pay

## 2021-05-19 ENCOUNTER — Ambulatory Visit (HOSPITAL_COMMUNITY)
Admission: RE | Admit: 2021-05-19 | Discharge: 2021-05-19 | Disposition: A | Discharge status: Home / Self Care | Payer: Medicare Other | Source: Ambulatory Visit | Attending: Urology | Admitting: Urology

## 2021-05-19 DIAGNOSIS — N131 Hydronephrosis with ureteral stricture, not elsewhere classified: Secondary | ICD-10-CM | POA: Diagnosis not present

## 2021-05-19 DIAGNOSIS — N200 Calculus of kidney: Secondary | ICD-10-CM | POA: Diagnosis not present

## 2021-05-26 ENCOUNTER — Telehealth (INDEPENDENT_AMBULATORY_CARE_PROVIDER_SITE_OTHER): Payer: Medicare Other | Admitting: Urology

## 2021-05-26 ENCOUNTER — Other Ambulatory Visit: Payer: Self-pay

## 2021-05-26 ENCOUNTER — Encounter: Payer: Self-pay | Admitting: Urology

## 2021-05-26 DIAGNOSIS — N131 Hydronephrosis with ureteral stricture, not elsewhere classified: Secondary | ICD-10-CM

## 2021-05-26 NOTE — Progress Notes (Signed)
05/26/2021 2:31 PM   Amanda Case September 20, 1946 [id number]  Referring provider: Berenice Primas [address] [place],  [place] [address]  Patient location: home Physician location: office I connected with  Amanda Case on 05/26/21 by a video enabled telemedicine application and verified that I am speaking with the correct person using two identifiers.   I discussed the limitations of evaluation and management by telemedicine. The patient expressed understanding and agreed to proceed.    Followup ureteral stricture  HPI: Amanda Case is a 75yo here for followuop for right hydronephrosis related to a ureteral stricture. No issues with urination. No flank pain. Renal US 8/30 showed no right hydronephrosis. No other complaints today.    PMH: Past Medical History:  Diagnosis Date   History of kidney stones    Hypercholesterolemia    Hypertension    Kidney stone     Surgical History: Past Surgical History:  Procedure Laterality Date   ABDOMINAL HYSTERECTOMY     CYSTOSCOPY N/A 03/12/2021   Procedure: CYSTOSCOPY WITH FLUOROSCOPY;  Surgeon: Cleon Gustin, MD;  Location: [facility];  Service: Urology;  Laterality: N/A;   CYSTOSCOPY WITH RETROGRADE PYELOGRAM, URETEROSCOPY AND STENT PLACEMENT Right 12/19/2020   Procedure: CYSTOSCOPY WITH RETROGRADE PYELOGRAM, URETEROSCOPY DIAGNOSTIC;  Surgeon: Cleon Gustin, MD;  Location: [facility];  Service: Urology;  Laterality: Right;   IR NEPHROSTOMY PLACEMENT RIGHT  12/23/2020   IR NEPHROSTOMY TUBE CHANGE  02/17/2021   URETERAL REIMPLANTION Right 03/12/2021   Procedure: URETERAL REIMPLANT;  Surgeon: Cleon Gustin, MD;  Location: [facility];  Service: Urology;  Laterality: Right;    Home Medications:  Allergies as of 05/26/2021       Reactions   Cipro [ciprofloxacin-ciproflox Hcl Er] Hives        Medication List        Accurate as of May 26, 2021  2:31 PM. If you have any questions, ask your nurse or doctor.          aspirin EC  81 MG tablet Take 81 mg by mouth daily. Swallow whole.   cholecalciferol 25 MCG (1000 UNIT) tablet Commonly known as: VITAMIN D3 Take 1,000 Units by mouth daily.   hydrochlorothiazide 12.5 MG capsule Commonly known as: MICROZIDE Take 12.5 mg by mouth daily.   losartan 100 MG tablet Commonly known as: COZAAR Take 100 mg by mouth daily.   rosuvastatin 10 MG tablet Commonly known as: CRESTOR Take 10 mg by mouth at bedtime.   sulfamethoxazole-trimethoprim 800-160 MG tablet Commonly known as: BACTRIM DS Take 1 tablet by mouth every 12 (twelve) hours.   vitamin B-12 500 MCG tablet Commonly known as: CYANOCOBALAMIN Take 500 mcg by mouth daily.        Allergies:  Allergies  Allergen Reactions   Cipro [Ciprofloxacin-Ciproflox Hcl Er] Hives    Family History: No family history on file.  Social History:  reports that she has never smoked. She has never used smokeless tobacco. She reports that she does not drink alcohol and does not use drugs.  ROS: All other review of systems were reviewed and are negative except what is noted above in HPI   Laboratory Data: Lab Results  Component Value Date   WBC 12.0 (H) 03/13/2021   HGB 11.1 (L) 03/13/2021   HCT 34.7 (L) 03/13/2021   MCV 94.0 03/13/2021   PLT 241 03/13/2021    Lab Results  Component Value Date   CREATININE 1.15 (H) 03/13/2021    No results found  for: PSA  No results found for: TESTOSTERONE  No results found for: HGBA1C  Urinalysis    Component Value Date/Time   COLORURINE YELLOW 02/18/2021 2353   APPEARANCEUR Hazy (A) 03/06/2021 1406   LABSPEC 1.010 02/18/2021 2353   PHURINE 6.0 02/18/2021 2353   GLUCOSEU Negative 03/06/2021 1406   HGBUR MODERATE (A) 02/18/2021 2353   BILIRUBINUR Negative 03/06/2021 1406   [place] 02/18/2021 2353   PROTEINUR Negative 03/06/2021 1406   PROTEINUR 100 (A) 02/18/2021 2353   UROBILINOGEN negative (A) 01/02/2020 0913   NITRITE Negative 03/06/2021 1406    NITRITE NEGATIVE 02/18/2021 2353   LEUKOCYTESUR Negative 03/06/2021 1406   LEUKOCYTESUR LARGE (A) 02/18/2021 2353    Lab Results  Component Value Date   LABMICR See below: 03/06/2021   WBCUA 0-5 03/06/2021   LABEPIT 0-10 03/06/2021   BACTERIA None seen 03/06/2021    Pertinent Imaging: Renal US 8/30: Images reviewed and discussed with the patient No results found for this or any previous visit.  No results found for this or any previous visit.  No results found for this or any previous visit.  No results found for this or any previous visit.  Results for orders placed during the hospital encounter of 05/19/21  Ultrasound renal complete  Narrative CLINICAL DATA:  75 year old female with a history of right hydronephrosis and hydroureter. Right nephrostomy or nephroureteral stent, removed in June.  EXAM: RENAL / URINARY TRACT ULTRASOUND COMPLETE  COMPARISON:  CT Abdomen and Pelvis 02/18/2021 and earlier.  FINDINGS: Right Kidney:  Renal measurements: 9.3 x 3.5 x 4.6 cm = volume: 78 mL. Asymmetric right renal cortical volume loss. Cortical echogenicity is at the upper limits of normal. No right hydronephrosis (image 7) when compared to that on the March CT. There is minimal prominence of the right renal pelvis which is probably physiologic (images 11, 27, 73). Possible 4 mm midpole calculus on image 4. No right renal mass.  Left Kidney:  Renal measurements: 11.0 x 5.1 x 5.6 cm = volume: 166 mL. No left hydronephrosis. 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 Normal cortical echogenicity.  Bladder:  Appears normal for degree of bladder distention. Only the left ureteral jet was detected with Doppler. Prevoid bladder volume estimated at 400 mL, with a small 22 mL postvoid residual.  Other:  None.  IMPRESSION: 1. Right hydronephrosis as seen by CT in March remains resolved following right nephrostomy removal.  Mild right renal cortical atrophy is suspected. There is a possible 4 mm right renal midpole calculus. 2. Left kidney is stable and within normal limits. 3. 400 mL prevoid bladder volume although no significant postvoid residual (22 mL).   Electronically Signed By: Genevie Ann M.D. On: 05/21/2021 06:39  No results found for this or any previous visit.  Results for orders placed during the hospital encounter of 12/06/19  CT HEMATURIA WORKUP  Narrative CLINICAL DATA:  Chronic UTI for 2 years. Hematuria.  EXAM: CT ABDOMEN AND PELVIS WITHOUT AND WITH CONTRAST  TECHNIQUE: Multidetector CT imaging of the abdomen and pelvis was performed following the standard protocol before and following the bolus administration of intravenous contrast.  CONTRAST:  139m OMNIPAQUE IOHEXOL 300 MG/ML  SOLN  COMPARISON:  No prior images are available for review. Report from 2018 is noted.  FINDINGS: Lower chest: Basilar atelectasis. No effusion.  Hepatobiliary: Mildly lobular hepatic contours. No signs of focal hepatic lesion. Biliary tree is normal. Portal vein is patent.  Pancreas: Pancreas without signs of inflammation, ductal dilation or focal lesion.  Spleen: Spleen is normal size without focal lesion.  Adrenals/Urinary Tract: Adrenal glands are normal.  Renal enhancement is symmetric. No evidence of hydronephrosis. Urinary bladder displaced by large pelvic mass. No suspicious renal lesion.  Excretory phase with limited assessment of the distal ureters. No secondary signs of stricture. The no lesion in the upper tracts.  No urinary tract calculus.  Stomach/Bowel: Small hiatal hernia. No bowel obstruction or acute bowel process. Normal appendix. Sigmoid diverticulosis.  Vascular/Lymphatic: Atheromatous changes in the abdominal aorta. No aneurysm. No adenopathy in the retroperitoneum or in the upper abdomen. No pelvic nodal enlargement. Hazy central small bowel mesentery with  scattered small lymph nodes.  Reproductive: Uterus is not identifiable as a separate structure. There is a large mass in the pelvis with some subtle peripheral calcification. Lesion does not show infiltrative margins and measures approximately 15.1 x 14.7 x 11.2 cm. The uterus appears to be displaced along the left lateral margin of the mass.  The right and left ovary are likely also displaced but not well defined as discrete structures on the current CT study with engorgement of ovarian veins more so on the right than the left.  Other: No ascites.  Musculoskeletal: Spinal degenerative changes without acute or destructive bone finding.  IMPRESSION: 1. 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 There are no infiltrative margins aside from the inability to separate discrete pelvic reproductive structures from this process. Also the absence of adenopathy is reassuring. There is an ultrasound report available from 2007 that also references a mass. Correlate with any surgeries in the interim and with follow-up pelvic imaging with ultrasound or MRI as warranted. 2. No evidence of urinary tract calculus or obstruction. No signs of upper tract lesion to explain hematuria though the mid right ureter and distal right ureter show limited assessment due to lack of opacification. 3. Mildly lobular hepatic contours may reflect underlying cirrhosis. No focal hepatic lesion. 4. Sigmoid diverticulosis. 5. Small hiatal hernia. 6. Aortic atherosclerosis.  Aortic Atherosclerosis (ICD10-I70.0).   Electronically Signed By: Zetta Bills M.D. On: 12/07/2019 09:09  Results for orders placed during the hospital encounter of 02/18/21  CT Renal Stone Study  Narrative CLINICAL DATA:  Flank pain, kidney stone, fever, recent nephrostomy tube change  EXAM: CT ABDOMEN AND PELVIS WITHOUT CONTRAST  TECHNIQUE: Multidetector CT  imaging of the abdomen and pelvis was performed following the standard protocol without IV contrast. Sagittal and coronal MPR images reconstructed from axial data set.  COMPARISON:  12/12/2020  FINDINGS: Lower chest: Subsegmental atelectasis LEFT lower lobe. RIGHT lung base clear.  Hepatobiliary: Gallbladder and liver normal appearance  Pancreas: Normal appearance  Spleen: Normal appearance  Adrenals/Urinary Tract: Adrenal glands, LEFT kidney, and LEFT ureter normal appearance. RIGHT nephrostomy tube present. No definite RIGHT renal mass or hydronephrosis. RIGHT ureter decompressed without identified urinary tract calcification. Bladder unremarkable.  Stomach/Bowel: Normal appendix. Diverticulosis of sigmoid colon without evidence of diverticulitis. Few ascending colonic diverticula. And remaining bowel loops unremarkable.  Vascular/Lymphatic: Atherosclerotic calcifications aorta without aneurysm. No adenopathy.  Reproductive: Uterus surgically absent. Nonvisualization of ovaries.  Other: No free air or free fluid. Minimal stranding in small-bowel mesentery question mint fibrosing mesenteritis unchanged. No hernia or acute inflammatory process.  Musculoskeletal: Bones demineralized with degenerative disc disease changes lumbar spine.  IMPRESSION: RIGHT nephrostomy tube without hydronephrosis, hydroureter, or adjacent fluid collection.  Colonic  diverticulosis without evidence of diverticulitis.  No acute intra-abdominal or intrapelvic abnormalities.  Aortic Atherosclerosis (ICD10-I70.0).   Electronically Signed By: Lavonia Dana M.D. 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  Nicolette Bang, MD  [facility] [place]

## 2021-05-26 NOTE — Patient Instructions (Signed)
Hydronephrosis ?Hydronephrosis is the swelling of one or both kidneys due to a blockage that stops urine from flowing out of the body. Kidneys filter waste from the blood and produce urine. This condition can lead to kidney failure and may become life-threatening if not treated promptly. ?What are the causes? ?In infants and children, common causes include problems that occur when a baby is developing in the womb. These can include problems in the kidneys or in the tubes that drain urine into the bladder (ureters). ?In adults, common causes include: ?Kidney stones. ?Pregnancy. ?A tumor or cyst in the abdomen or pelvis. ?An enlarged prostate gland. ?Other causes include: ?Bladder infection. ?Scar tissue from a previous surgery or injury. ?A blood clot. ?Cancer of the prostate, bladder, uterus, ovary, or colon. ?What are the signs or symptoms? ?Symptoms of this condition include: ?Pain or discomfort in your side (flank) or abdomen. ?Swelling in your abdomen. ?Nausea and vomiting. ?Fever. ?Pain when passing urine. ?Feelings of urgency when you need to urinate. ?Urinating more often than normal. ?In some cases, you may not have any symptoms. ?How is this diagnosed? ?This condition may be diagnosed based on: ?Your symptoms and medical history. ?A physical exam. ?Blood and urine tests. ?Imaging tests, such as an ultrasound, CT scan, or MRI. ?A procedure to look at your urinary tract and bladder by inserting a scope into the urethra (cystoscopy). ?How is this treated? ?Treatment for this condition depends on where the blockage is, how long it has been there, and what caused it. The goal of treatment is to remove the blockage. Treatment may include: ?Antibiotic medicines to treat or prevent infection. ?A procedure to place a small, thin tube (stent) into a blocked ureter. The stent will keep the ureter open so that urine can drain through it. ?A nonsurgical procedure that crushes kidney stones with shock waves  (extracorporeal shock wave lithotripsy). ?If kidney failure occurs, treatment may include dialysis or a kidney transplant. ?Follow these instructions at home: ? ?Take over-the-counter and prescription medicines only as told by your health care provider. ?If you were prescribed an antibiotic medicine, take it exactly as told by your health care provider. Do not stop taking the antibiotic even if you start to feel better. ?Rest and return to your normal activities as told by your health care provider. Ask your health care provider what activities are safe for you. ?Drink enough fluid to keep your urine pale yellow. ?Keep all follow-up visits. This is important. ?Contact a health care provider if: ?You continue to have symptoms after treatment. ?You develop new symptoms. ?Your urine becomes cloudy or bloody. ?You have a fever. ?Get help right away if: ?You have severe flank or abdominal pain. ?You cannot drink fluids without vomiting. ?Summary ?Hydronephrosis is the swelling of one or both kidneys due to a blockage that stops urine from flowing out of the body. ?Hydronephrosis can lead to kidney failure and may become life-threatening if not treated promptly. ?The goal of treatment is to remove the blockage. It may include a procedure to insert a stent into a blocked ureter, a procedure to break up kidney stones, or taking antibiotic medicines. ?Follow your health care provider's instructions for taking care of yourself at home, including instructions about drinking fluids, taking medicines, and limiting activities. ?This information is not intended to replace advice given to you by your health care provider. Make sure you discuss any questions you have with your health care provider. ?Document Revised: 12/25/2019 Document Reviewed: 12/25/2019 ?Elsevier Patient   Education ? 2022 Elsevier Inc. ? ?

## 2021-05-28 NOTE — Progress Notes (Signed)
Sent via mychart

## 2021-07-23 DIAGNOSIS — Z713 Dietary counseling and surveillance: Secondary | ICD-10-CM | POA: Diagnosis not present

## 2021-07-23 DIAGNOSIS — I10 Essential (primary) hypertension: Secondary | ICD-10-CM | POA: Diagnosis not present

## 2021-07-23 DIAGNOSIS — Z299 Encounter for prophylactic measures, unspecified: Secondary | ICD-10-CM | POA: Diagnosis not present

## 2021-07-23 DIAGNOSIS — N183 Chronic kidney disease, stage 3 unspecified: Secondary | ICD-10-CM | POA: Diagnosis not present

## 2021-08-03 ENCOUNTER — Other Ambulatory Visit: Payer: Self-pay

## 2021-08-21 ENCOUNTER — Other Ambulatory Visit: Payer: Self-pay

## 2021-08-21 ENCOUNTER — Ambulatory Visit (HOSPITAL_COMMUNITY)
Admission: RE | Admit: 2021-08-21 | Discharge: 2021-08-21 | Disposition: A | Discharge status: Home / Self Care | Payer: Medicare Other | Source: Ambulatory Visit | Attending: Urology | Admitting: Urology

## 2021-08-21 DIAGNOSIS — N131 Hydronephrosis with ureteral stricture, not elsewhere classified: Secondary | ICD-10-CM

## 2021-08-21 DIAGNOSIS — N281 Cyst of kidney, acquired: Secondary | ICD-10-CM | POA: Diagnosis not present

## 2021-08-21 DIAGNOSIS — N1330 Unspecified hydronephrosis: Secondary | ICD-10-CM | POA: Diagnosis not present

## 2021-08-28 ENCOUNTER — Ambulatory Visit: Payer: Medicare Other | Admitting: Urology

## 2021-08-28 ENCOUNTER — Other Ambulatory Visit: Payer: Self-pay

## 2021-08-28 ENCOUNTER — Encounter: Payer: Self-pay | Admitting: Urology

## 2021-08-28 VITALS — BP 146/69 | HR 86

## 2021-08-28 DIAGNOSIS — R300 Dysuria: Secondary | ICD-10-CM | POA: Diagnosis not present

## 2021-08-28 DIAGNOSIS — N131 Hydronephrosis with ureteral stricture, not elsewhere classified: Secondary | ICD-10-CM | POA: Diagnosis not present

## 2021-08-28 DIAGNOSIS — R3912 Poor urinary stream: Secondary | ICD-10-CM

## 2021-08-28 DIAGNOSIS — R310 Gross hematuria: Secondary | ICD-10-CM | POA: Diagnosis not present

## 2021-08-28 LAB — MICROSCOPIC EXAMINATION
Bacteria, UA: NONE SEEN
Renal Epithel, UA: NONE SEEN /hpf

## 2021-08-28 LAB — URINALYSIS, ROUTINE W REFLEX MICROSCOPIC
Bilirubin, UA: NEGATIVE
Glucose, UA: NEGATIVE
Ketones, UA: NEGATIVE
Leukocytes,UA: NEGATIVE
Nitrite, UA: NEGATIVE
Protein,UA: NEGATIVE
Specific Gravity, UA: 1.01 (ref 1.005–1.030)
Urobilinogen, Ur: 0.2 mg/dL (ref 0.2–1.0)
pH, UA: 7 (ref 5.0–7.5)

## 2021-08-28 NOTE — Patient Instructions (Signed)
Hydronephrosis ?Hydronephrosis is the swelling of one or both kidneys due to a blockage that stops urine from flowing out of the body. Kidneys filter waste from the blood and produce urine. This condition can lead to kidney failure and may become life-threatening if not treated promptly. ?What are the causes? ?In infants and children, common causes include problems that occur when a baby is developing in the womb. These can include problems in the kidneys or in the tubes that drain urine into the bladder (ureters). ?In adults, common causes include: ?Kidney stones. ?Pregnancy. ?A tumor or cyst in the abdomen or pelvis. ?An enlarged prostate gland. ?Other causes include: ?Bladder infection. ?Scar tissue from a previous surgery or injury. ?A blood clot. ?Cancer of the prostate, bladder, uterus, ovary, or colon. ?What are the signs or symptoms? ?Symptoms of this condition include: ?Pain or discomfort in your side (flank) or abdomen. ?Swelling in your abdomen. ?Nausea and vomiting. ?Fever. ?Pain when passing urine. ?Feelings of urgency when you need to urinate. ?Urinating more often than normal. ?In some cases, you may not have any symptoms. ?How is this diagnosed? ?This condition may be diagnosed based on: ?Your symptoms and medical history. ?A physical exam. ?Blood and urine tests. ?Imaging tests, such as an ultrasound, CT scan, or MRI. ?A procedure to look at your urinary tract and bladder by inserting a scope into the urethra (cystoscopy). ?How is this treated? ?Treatment for this condition depends on where the blockage is, how long it has been there, and what caused it. The goal of treatment is to remove the blockage. Treatment may include: ?Antibiotic medicines to treat or prevent infection. ?A procedure to place a small, thin tube (stent) into a blocked ureter. The stent will keep the ureter open so that urine can drain through it. ?A nonsurgical procedure that crushes kidney stones with shock waves  (extracorporeal shock wave lithotripsy). ?If kidney failure occurs, treatment may include dialysis or a kidney transplant. ?Follow these instructions at home: ? ?Take over-the-counter and prescription medicines only as told by your health care provider. ?If you were prescribed an antibiotic medicine, take it exactly as told by your health care provider. Do not stop taking the antibiotic even if you start to feel better. ?Rest and return to your normal activities as told by your health care provider. Ask your health care provider what activities are safe for you. ?Drink enough fluid to keep your urine pale yellow. ?Keep all follow-up visits. This is important. ?Contact a health care provider if: ?You continue to have symptoms after treatment. ?You develop new symptoms. ?Your urine becomes cloudy or bloody. ?You have a fever. ?Get help right away if: ?You have severe flank or abdominal pain. ?You cannot drink fluids without vomiting. ?Summary ?Hydronephrosis is the swelling of one or both kidneys due to a blockage that stops urine from flowing out of the body. ?Hydronephrosis can lead to kidney failure and may become life-threatening if not treated promptly. ?The goal of treatment is to remove the blockage. It may include a procedure to insert a stent into a blocked ureter, a procedure to break up kidney stones, or taking antibiotic medicines. ?Follow your health care provider's instructions for taking care of yourself at home, including instructions about drinking fluids, taking medicines, and limiting activities. ?This information is not intended to replace advice given to you by your health care provider. Make sure you discuss any questions you have with your health care provider. ?Document Revised: 12/25/2019 Document Reviewed: 12/25/2019 ?Elsevier Patient   Education ? 2022 Elsevier Inc. ? ?

## 2021-08-28 NOTE — Progress Notes (Signed)

## 2021-08-28 NOTE — Progress Notes (Signed)
08/28/2021 1:19 PM   Amanda Case 1946-03-11 [id number]  Referring provider: Berenice Primas [address] [place],  [place] [address]  Followup right hydronephrosis   HPI: Amanda Case is a 75yo here for followup for right hydronephrosis. No flank pain. She drinks 60oz of water daily. Renal US 08/23/2021 shows mild right pelvic fullness and a 5mm right lower pole calculus. No significant LUTS.    PMH: Past Medical History:  Diagnosis Date   History of kidney stones    Hypercholesterolemia    Hypertension    Kidney stone     Surgical History: Past Surgical History:  Procedure Laterality Date   ABDOMINAL HYSTERECTOMY     CYSTOSCOPY N/A 03/12/2021   Procedure: CYSTOSCOPY WITH FLUOROSCOPY;  Surgeon: Cleon Gustin, MD;  Location: [facility];  Service: Urology;  Laterality: N/A;   CYSTOSCOPY WITH RETROGRADE PYELOGRAM, URETEROSCOPY AND STENT PLACEMENT Right 12/19/2020   Procedure: CYSTOSCOPY WITH RETROGRADE PYELOGRAM, URETEROSCOPY DIAGNOSTIC;  Surgeon: Cleon Gustin, MD;  Location: [facility];  Service: Urology;  Laterality: Right;   IR NEPHROSTOMY PLACEMENT RIGHT  12/23/2020   IR NEPHROSTOMY TUBE CHANGE  02/17/2021   URETERAL REIMPLANTION Right 03/12/2021   Procedure: URETERAL REIMPLANT;  Surgeon: Cleon Gustin, MD;  Location: [facility];  Service: Urology;  Laterality: Right;    Home Medications:  Allergies as of 08/28/2021       Reactions   Cipro [ciprofloxacin-ciproflox Hcl Er] Hives        Medication List        Accurate as of August 28, 2021  1:19 PM. If you have any questions, ask your nurse or doctor.          aspirin EC 81 MG tablet Take 81 mg by mouth daily. Swallow whole.   cholecalciferol 25 MCG (1000 UNIT) tablet Commonly known as: VITAMIN D3 Take 1,000 Units by mouth daily.   hydrochlorothiazide 12.5 MG capsule Commonly known as: MICROZIDE Take 12.5 mg by mouth daily.   losartan 100 MG tablet Commonly known as: COZAAR Take 100 mg by mouth  daily.   rosuvastatin 10 MG tablet Commonly known as: CRESTOR Take 10 mg by mouth at bedtime.   sulfamethoxazole-trimethoprim 800-160 MG tablet Commonly known as: BACTRIM DS Take 1 tablet by mouth every 12 (twelve) hours.   vitamin B-12 500 MCG tablet Commonly known as: CYANOCOBALAMIN Take 500 mcg by mouth daily.        Allergies:  Allergies  Allergen Reactions   Cipro [Ciprofloxacin-Ciproflox Hcl Er] Hives    Family History: No family history on file.  Social History:  reports that she has never smoked. She has never used smokeless tobacco. She reports that she does not drink alcohol and does not use drugs.  ROS: All other review of systems were reviewed and are negative except what is noted above in HPI  Physical Exam: BP (!) 146/69   Pulse 86   Constitutional:  Alert and oriented, No acute distress. HEENT:  AT, moist mucus membranes.  Trachea midline, no masses. Cardiovascular: No clubbing, cyanosis, or edema. Respiratory: Normal respiratory effort, no increased work of breathing. GI: Abdomen is soft, nontender, nondistended, no abdominal masses GU: No CVA tenderness.  Lymph: No cervical or inguinal lymphadenopathy. Skin: No rashes, bruises or suspicious lesions. Neurologic: Grossly intact, no focal deficits, moving all 4 extremities. Psychiatric: Normal mood and affect.  Laboratory Data: Lab Results  Component Value Date   WBC 12.0 (H) 03/13/2021   HGB 11.1 (L) 03/13/2021  HCT 34.7 (L) 03/13/2021   MCV 94.0 03/13/2021   PLT 241 03/13/2021    Lab Results  Component Value Date   CREATININE 1.15 (H) 03/13/2021    No results found for: PSA  No results found for: TESTOSTERONE  No results found for: HGBA1C  Urinalysis    Component Value Date/Time   COLORURINE YELLOW 02/18/2021 2353   APPEARANCEUR Hazy (A) 03/06/2021 1406   LABSPEC 1.010 02/18/2021 2353   PHURINE 6.0 02/18/2021 2353   GLUCOSEU Negative 03/06/2021 1406   HGBUR MODERATE (A)  02/18/2021 2353   BILIRUBINUR Negative 03/06/2021 1406   [place] 02/18/2021 2353   PROTEINUR Negative 03/06/2021 1406   PROTEINUR 100 (A) 02/18/2021 2353   UROBILINOGEN negative (A) 01/02/2020 0913   NITRITE Negative 03/06/2021 1406   NITRITE NEGATIVE 02/18/2021 2353   LEUKOCYTESUR Negative 03/06/2021 1406   LEUKOCYTESUR LARGE (A) 02/18/2021 2353    Lab Results  Component Value Date   LABMICR See below: 03/06/2021   WBCUA 0-5 03/06/2021   LABEPIT 0-10 03/06/2021   BACTERIA None seen 03/06/2021    Pertinent Imaging: Renal US 08/21/2021: Images reviewed and discussed with the patient No results found for this or any previous visit.  No results found for this or any previous visit.  No results found for this or any previous visit.  No results found for this or any previous visit.  Results for orders placed during the hospital encounter of 08/21/21  Ultrasound renal complete  Narrative CLINICAL DATA:  Follow-up right-sided hydronephrosis  EXAM: RENAL / URINARY TRACT ULTRASOUND COMPLETE  COMPARISON:  05/19/2021  FINDINGS: Right Kidney:  Renal measurements: 8.9 x 3.8 x 4.2 cm. = volume: 96 mL. Cortical thinning is noted in the right kidney. Minimal fullness of the renal pelvis is seen although no true hydronephrosis is noted. Proximal ureter is not significantly dilated. Echogenic focus is noted in the midportion of the right kidney measuring 4 mm suggestive of nonobstructing stone.  Left Kidney:  Renal measurements: 9.7 x 5.2 x 5.4 cm. = volume: 42 mL. 1.5 cm peripelvic cyst is noted similar to that seen on the prior exam. No mass lesion or hydronephrosis is noted.  Bladder:  Appears normal for degree of bladder distention. Left ureteral jet is well seen. Right ureteral jet is not well visualized on this exam.  Other:  None.  IMPRESSION: Minimal fullness of the right renal pelvis without definitive hydronephrosis.  Echogenic focus within  the midportion of the right kidney suspicious for 4 mm nonobstructing stone.  Left peripelvic cysts stable from the prior study.   Electronically Signed By: Inez Catalina M.D. On: 08/24/2021 21:10  No results found for this or any previous visit.  Results for orders placed during the hospital encounter of 12/06/19  CT HEMATURIA WORKUP  Narrative CLINICAL DATA:  Chronic UTI for 2 years. Hematuria.  EXAM: CT ABDOMEN AND PELVIS WITHOUT AND WITH CONTRAST  TECHNIQUE: Multidetector CT imaging of the abdomen and pelvis was performed following the standard protocol before and following the bolus administration of intravenous contrast.  CONTRAST:  149mL OMNIPAQUE IOHEXOL 300 MG/ML  SOLN  COMPARISON:  No prior images are available for review. Report from 2018 is noted.  FINDINGS: Lower chest: Basilar atelectasis. No effusion.  Hepatobiliary: Mildly lobular hepatic contours. No signs of focal hepatic lesion. Biliary tree is normal. Portal vein is patent.  Pancreas: Pancreas without signs of inflammation, ductal dilation or focal lesion.  Spleen: Spleen is normal size without focal lesion.  Adrenals/Urinary  Tract: Adrenal glands are normal.  Renal enhancement is symmetric. No evidence of hydronephrosis. Urinary bladder displaced by large pelvic mass. No suspicious renal lesion.  Excretory phase with limited assessment of the distal ureters. No secondary signs of stricture. The no lesion in the upper tracts.  No urinary tract calculus.  Stomach/Bowel: Small hiatal hernia. No bowel obstruction or acute bowel process. Normal appendix. Sigmoid diverticulosis.  Vascular/Lymphatic: Atheromatous changes in the abdominal aorta. No aneurysm. No adenopathy in the retroperitoneum or in the upper abdomen. No pelvic nodal enlargement. Hazy central small bowel mesentery with scattered small lymph nodes.  Reproductive: Uterus is not identifiable as a separate structure. There is a  large mass in the pelvis with some subtle peripheral calcification. Lesion does not show infiltrative margins and measures approximately 15.1 x 14.7 x 11.2 cm. The uterus appears to be displaced along the left lateral margin of the mass.  The right and left ovary are likely also displaced but not well defined as discrete structures on the current CT study with engorgement of ovarian veins more so on the right than the left.  Other: No ascites.  Musculoskeletal: Spinal degenerative changes without acute or destructive bone finding.  IMPRESSION: 1. 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 There are no infiltrative margins aside from the inability to separate discrete pelvic reproductive structures from this process. Also the absence of adenopathy is reassuring. There is an ultrasound report available from 2007 that also references a mass. Correlate with any surgeries in the interim and with follow-up pelvic imaging with ultrasound or MRI as warranted. 2. No evidence of urinary tract calculus or obstruction. No signs of upper tract lesion to explain hematuria though the mid right ureter and distal right ureter show limited assessment due to lack of opacification. 3. Mildly lobular hepatic contours may reflect underlying cirrhosis. No focal hepatic lesion. 4. Sigmoid diverticulosis. 5. Small hiatal hernia. 6. Aortic atherosclerosis.  Aortic Atherosclerosis (ICD10-I70.0).   Electronically Signed By: Zetta Bills M.D. On: 12/07/2019 09:09  Results for orders placed during the hospital encounter of 02/18/21  CT Renal Stone Study  Narrative CLINICAL DATA:  Flank pain, kidney stone, fever, recent nephrostomy tube change  EXAM: CT ABDOMEN AND PELVIS WITHOUT CONTRAST  TECHNIQUE: Multidetector CT imaging of the abdomen and pelvis was performed following the standard protocol without IV contrast. Sagittal  and coronal MPR images reconstructed from axial data set.  COMPARISON:  12/12/2020  FINDINGS: Lower chest: Subsegmental atelectasis LEFT lower lobe. RIGHT lung base clear.  Hepatobiliary: Gallbladder and liver normal appearance  Pancreas: Normal appearance  Spleen: Normal appearance  Adrenals/Urinary Tract: Adrenal glands, LEFT kidney, and LEFT ureter normal appearance. RIGHT nephrostomy tube present. No definite RIGHT renal mass or hydronephrosis. RIGHT ureter decompressed without identified urinary tract calcification. Bladder unremarkable.  Stomach/Bowel: Normal appendix. Diverticulosis of sigmoid colon without evidence of diverticulitis. Few ascending colonic diverticula. And remaining bowel loops unremarkable.  Vascular/Lymphatic: Atherosclerotic calcifications aorta without aneurysm. No adenopathy.  Reproductive: Uterus surgically absent. Nonvisualization of ovaries.  Other: No free air or free fluid. Minimal stranding in small-bowel mesentery question mint fibrosing mesenteritis unchanged. No hernia or acute inflammatory process.  Musculoskeletal: Bones demineralized with degenerative disc disease changes lumbar spine.  IMPRESSION: RIGHT nephrostomy tube without hydronephrosis, hydroureter, or adjacent fluid collection.  Colonic diverticulosis without evidence of diverticulitis.  No acute intra-abdominal or intrapelvic abnormalities.  Aortic Atherosclerosis (ICD10-I70.0).   Electronically Signed By: Elta [place]  Thornton Papas M.D. On: 02/18/2021 22:00   Assessment & Plan:    1. Right hydronephrosis -resolved. RTC 6 months with renal US - Urinalysis, Routine w reflex microscopic   No follow-ups on file.  Nicolette Bang, MD  [facility] [place]

## 2021-10-07 DIAGNOSIS — M778 Other enthesopathies, not elsewhere classified: Secondary | ICD-10-CM | POA: Diagnosis not present

## 2021-10-07 DIAGNOSIS — Z789 Other specified health status: Secondary | ICD-10-CM | POA: Diagnosis not present

## 2021-10-07 DIAGNOSIS — N183 Chronic kidney disease, stage 3 unspecified: Secondary | ICD-10-CM | POA: Diagnosis not present

## 2021-10-07 DIAGNOSIS — I10 Essential (primary) hypertension: Secondary | ICD-10-CM | POA: Diagnosis not present

## 2021-10-07 DIAGNOSIS — Z299 Encounter for prophylactic measures, unspecified: Secondary | ICD-10-CM | POA: Diagnosis not present

## 2021-10-07 DIAGNOSIS — I700 Atherosclerosis of aorta: Secondary | ICD-10-CM | POA: Diagnosis not present

## 2021-11-30 IMAGING — US IR NEPHROSTOMY PLACEMENT RIGHT
1 series · 3 of 3 positions shown · non-contrast
Comparison: None.

INDICATION: 74-year-old female referred for right-sided percutaneous nephrostomy

EXAM:
IMAGE GUIDED PLACEMENT OF RIGHT PERCUTANEOUS NEPHROSTOMY

[Series 1: ir nephrostomy placement right · 3 of 3 slices shown]
[im 1/3]
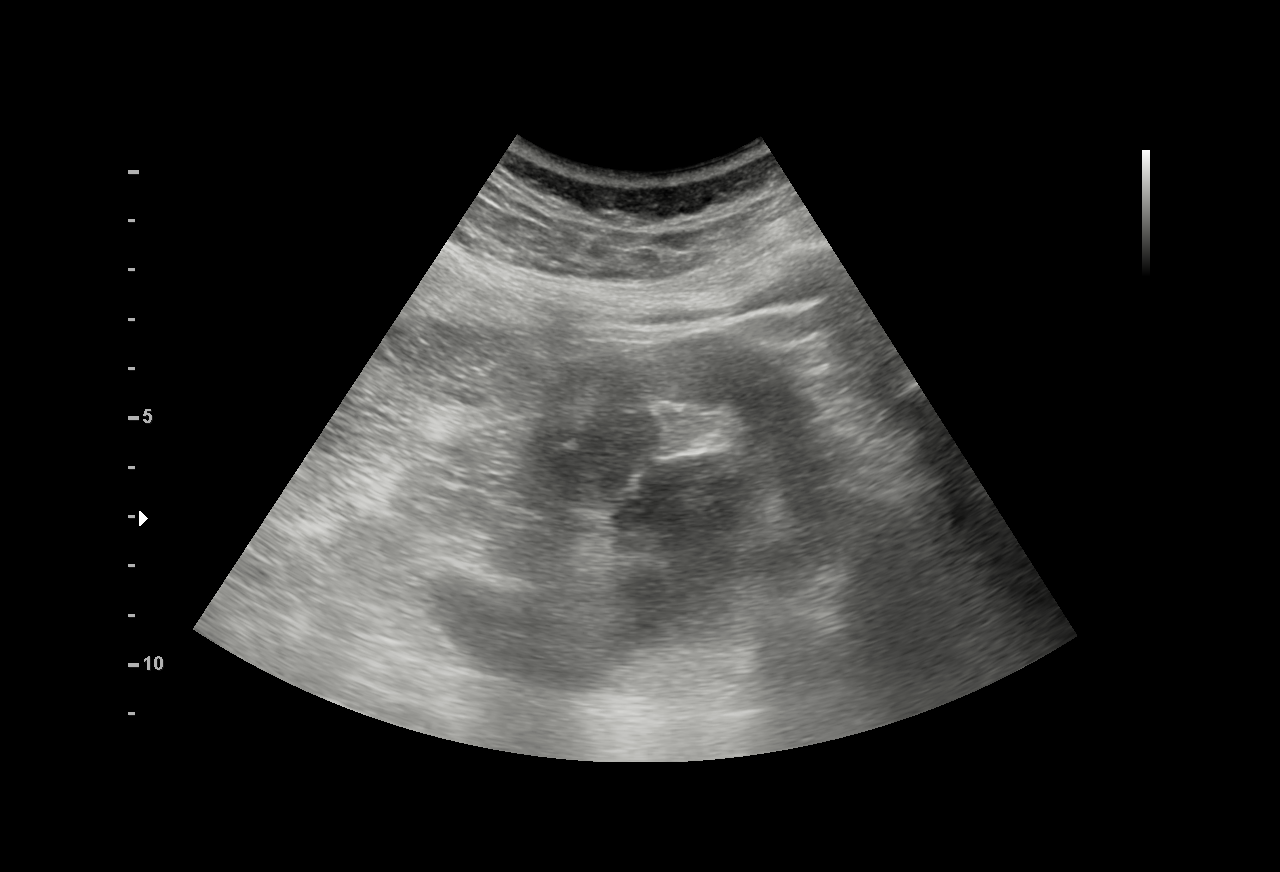
[im 2/3]
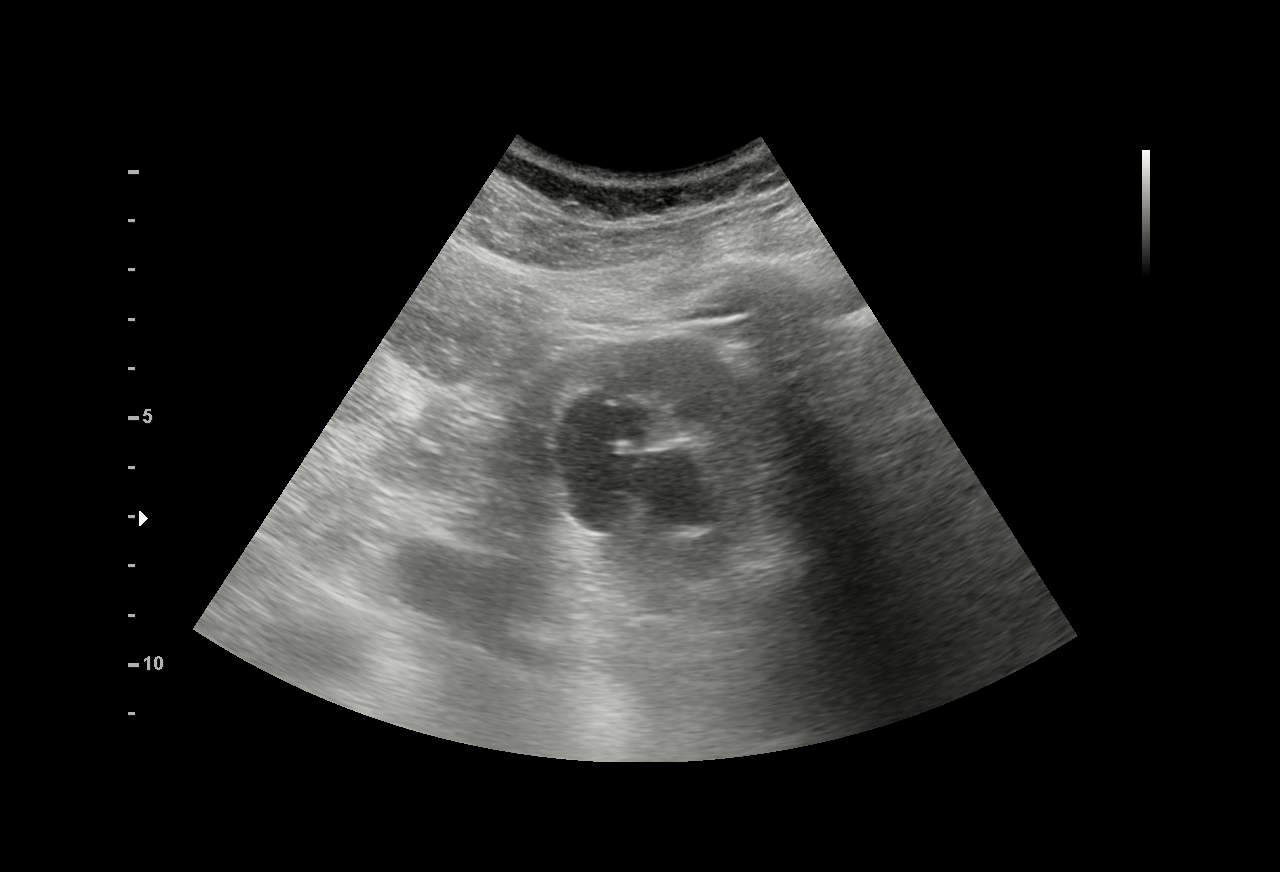
[im 3/3]
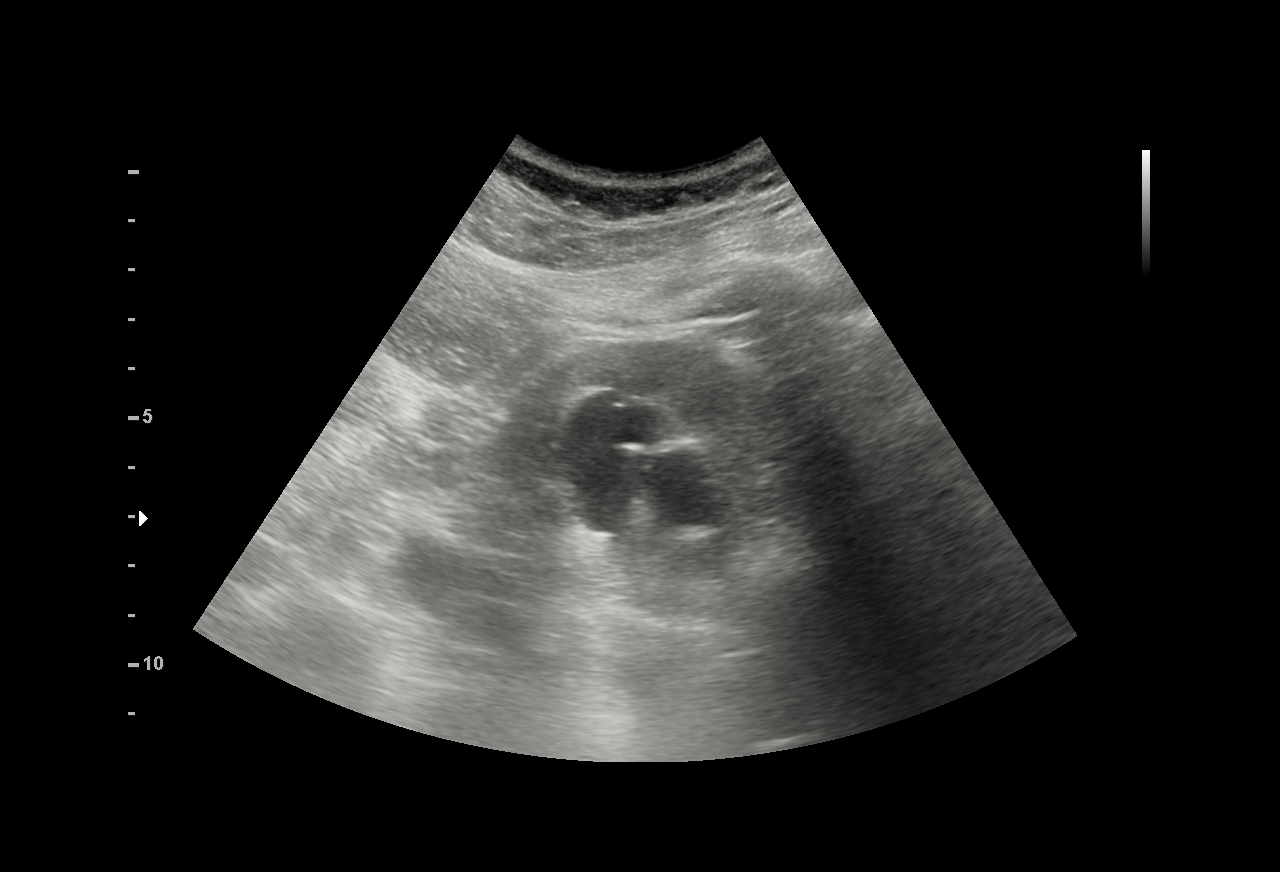

[3 of 3 positions shown; findings below may reference images not displayed]

MEDICATIONS:
10 mg IV hydralazine to address hypertension

ANESTHESIA/SEDATION:
Fentanyl 50 mcg IV; Versed 1.5 mg IV

Moderate Sedation Time:  11 minutes

The patient was continuously monitored during the procedure by the
interventional radiology nurse under my direct supervision.

CONTRAST:  10 cc-administered into the collecting system(s)

FLUOROSCOPY TIME:  Fluoroscopy Time: 0 minutes 42 seconds (2 mGy).

COMPLICATIONS:
None

PROCEDURE:
Informed written consent was obtained from the patient after a
thorough discussion of the procedural risks, benefits and
alternatives. All questions were addressed. Maximal Sterile Barrier
Technique was utilized including caps, mask, sterile gowns, sterile
gloves, sterile drape, hand hygiene and skin antiseptic. A timeout
was performed prior to the initiation of the procedure.

Patient positioned prone position on the fluoroscopy table.
Ultrasound survey of the right flank was performed with images
stored and sent to PACs.

The patient was then prepped and draped in the usual sterile
fashion. 1% lidocaine was used to anesthetize the skin and
subcutaneous tissues for local anesthesia.

A Chiba needle was then used to access a posterior inferior calyx
with ultrasound guidance. With spontaneous urine returned through
the needle, passage of an 018 micro wire into the collecting system
was performed under fluoroscopy.

A small incision was made with an 11 blade scalpel, and the needle
was removed from the wire.

An Accustick system was then advanced over the wire into the
collecting system under fluoroscopy. The metal stiffener and inner
dilator were removed,.

Bentson wire was passed into the collecting system and the sheath
removed. Ten French dilation of the soft tissues was performed.

Using modified Seldinger technique, a 10 French pigtail catheter
drain was placed over the Bentson wire.

Wire and inner stiffener removed, and the pigtail was formed in the
collecting system.

Small amount of contrast confirmed position of the catheter.

Patient tolerated the procedure well and remained hemodynamically
stable throughout.

No complications were encountered and no significant blood loss
encountered
IMPRESSION: Status post image guided placement of right-sided percutaneous
nephrostomy.

## 2022-01-11 ENCOUNTER — Other Ambulatory Visit: Payer: Self-pay | Admitting: Otolaryngology

## 2022-01-11 ENCOUNTER — Other Ambulatory Visit (HOSPITAL_COMMUNITY): Payer: Self-pay | Admitting: Otolaryngology

## 2022-01-11 DIAGNOSIS — E041 Nontoxic single thyroid nodule: Secondary | ICD-10-CM

## 2022-01-26 IMAGING — CT CT RENAL STONE PROTOCOL
2 of 4 series · 16 of 46 positions shown, 18 images · non-contrast
Comparison: 12/12/2020

CLINICAL DATA: Flank pain, kidney stone, fever, recent nephrostomy
tube change

EXAM:
CT ABDOMEN AND PELVIS WITHOUT CONTRAST
TECHNIQUE: Multidetector CT imaging of the abdomen and pelvis was performed
following the standard protocol without IV contrast. Sagittal and
coronal MPR images reconstructed from axial data set.

[Series 2: axial st · axial · 0.92mm/px · z∈[-540,-116]mm · 13 of 95 slices shown, 15 images]
[im 5/95  soft-tissue]
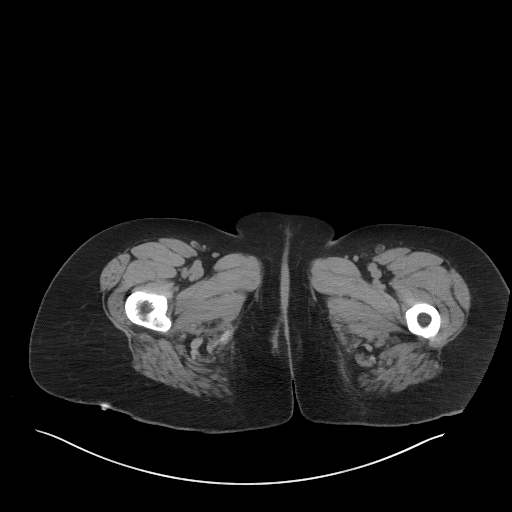
[im 5/95  bone]
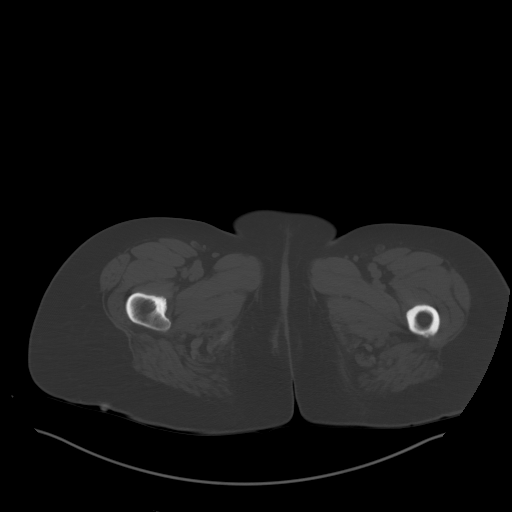
[im 15/95  soft-tissue]
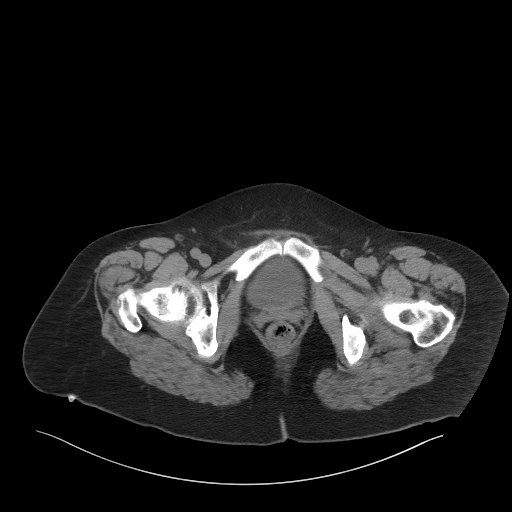
[im 20/95  soft-tissue]
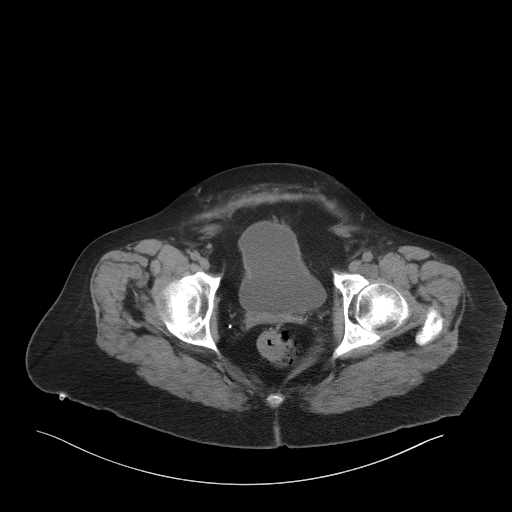
[im 25/95  soft-tissue]
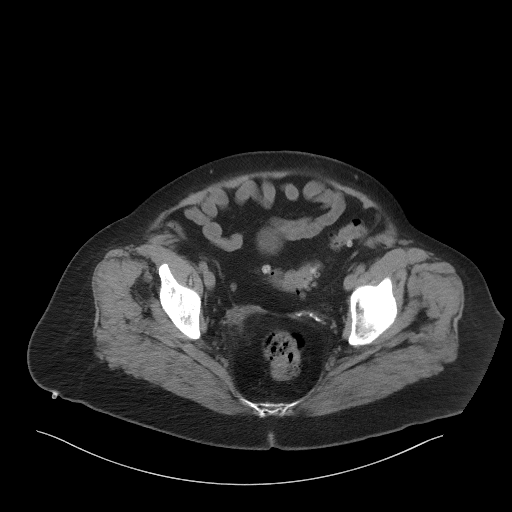
[im 35/95  soft-tissue]
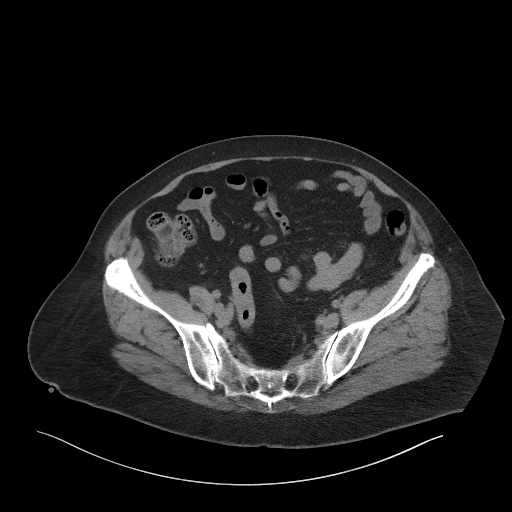
[im 40/95  soft-tissue]
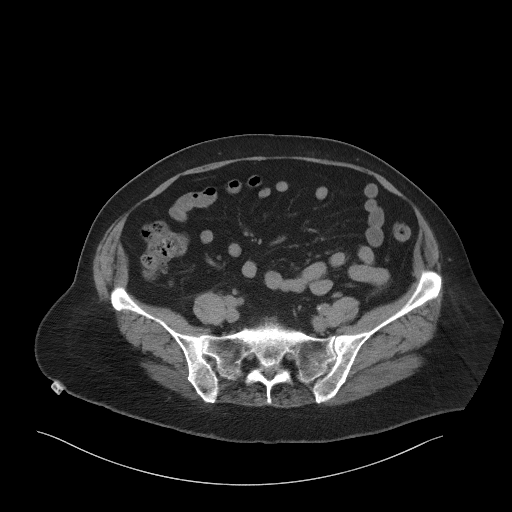
[im 50/95  soft-tissue]
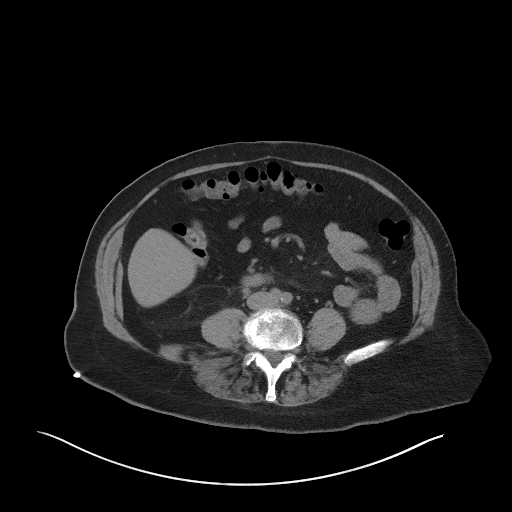
[im 55/95  soft-tissue]
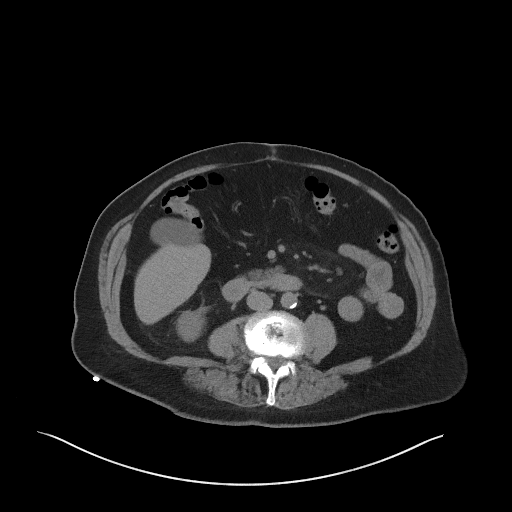
[im 60/95  soft-tissue]
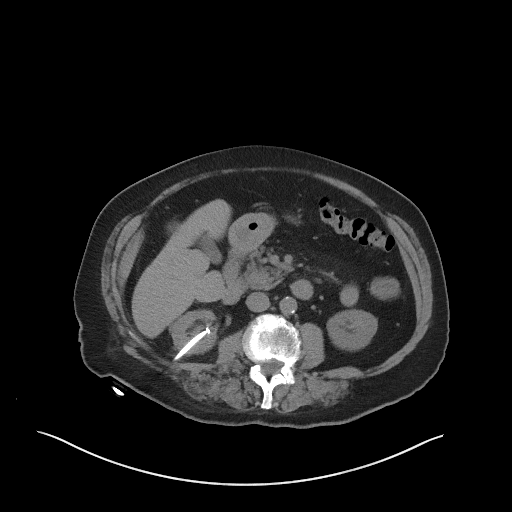
[im 60/95  bone]
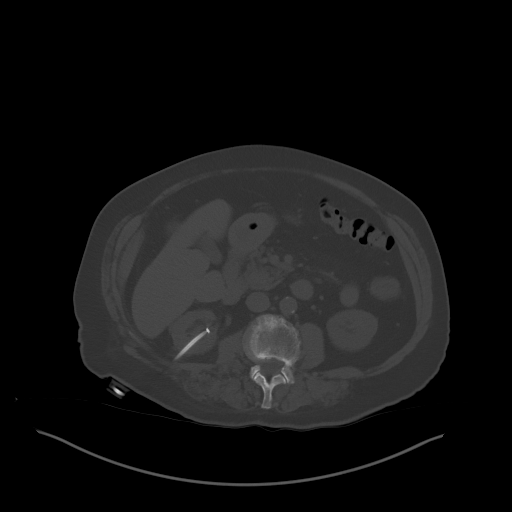
[im 70/95  soft-tissue]
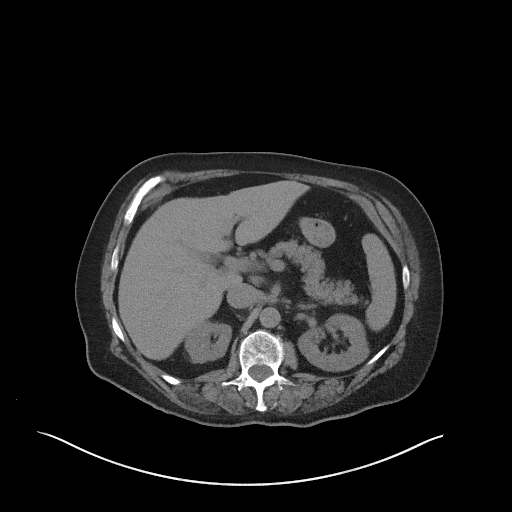
[im 75/95  soft-tissue]
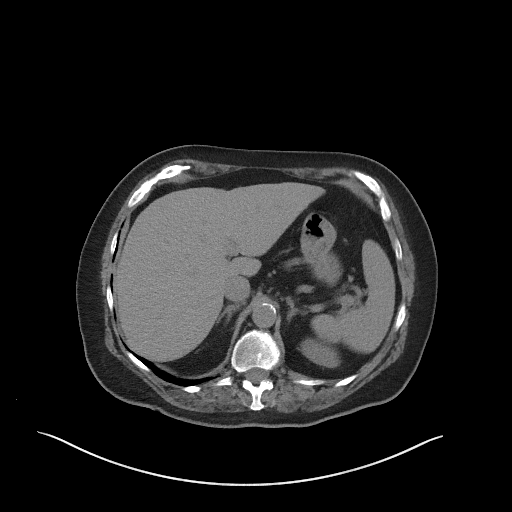
[im 80/95  soft-tissue]
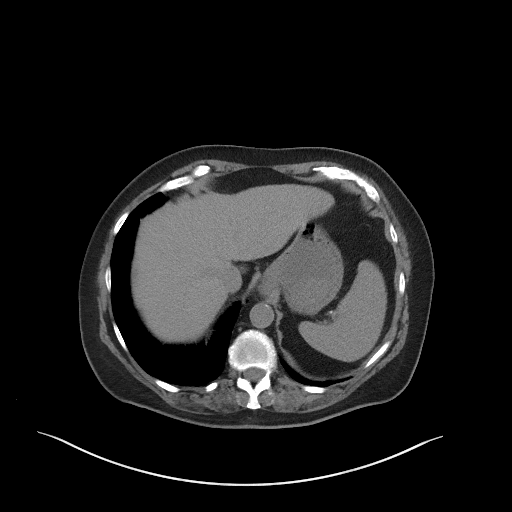
[im 90/95  soft-tissue]
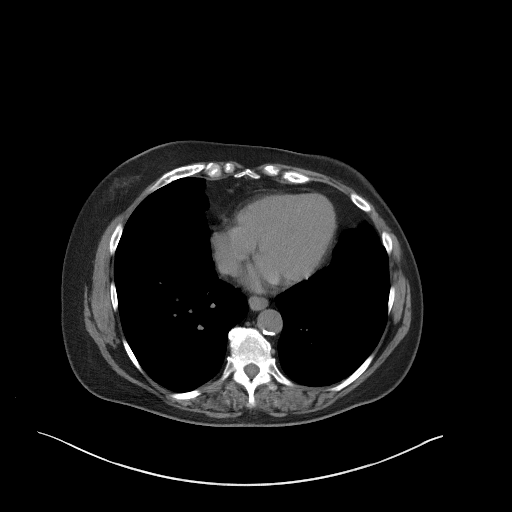

[Series 5: coronal st · coronal · 0.82mm/px · 3 of 102 slices shown]
[im 34/102  soft-tissue]
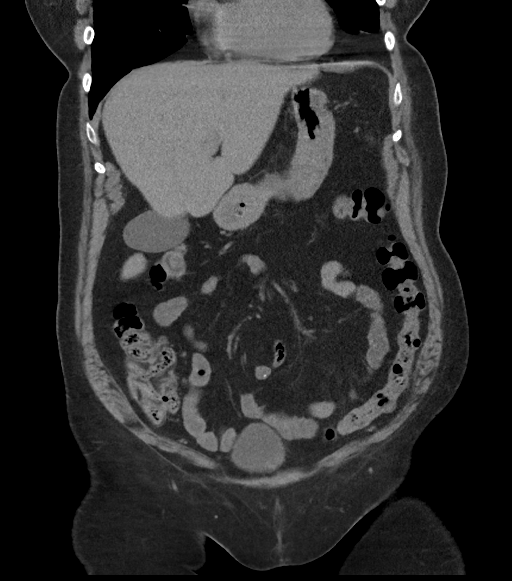
[im 45/102  soft-tissue]
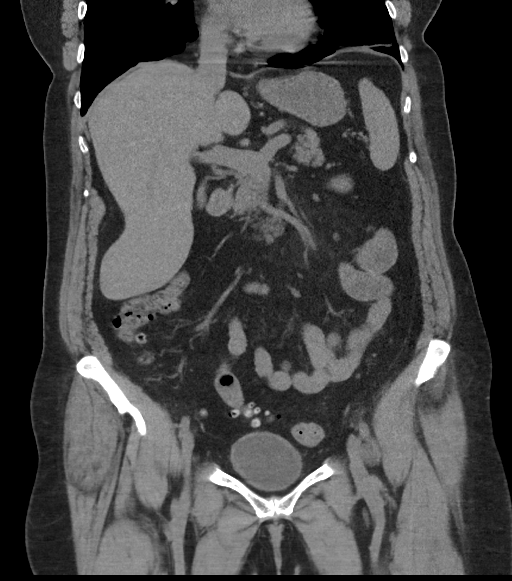
[im 57/102  soft-tissue]
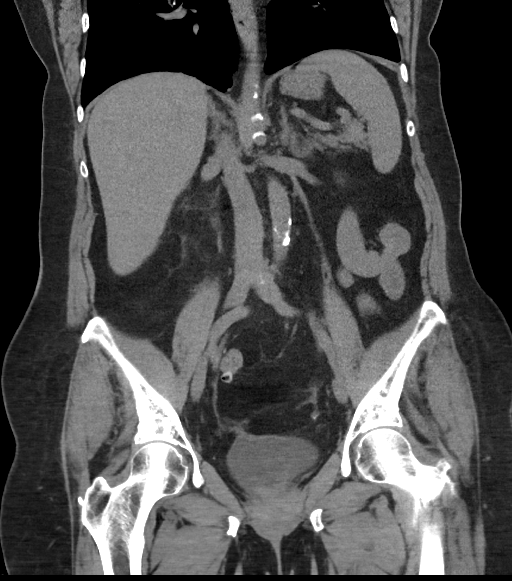

[16 of 46 positions shown; findings below may reference images not displayed]

FINDINGS: Lower chest: Subsegmental atelectasis LEFT lower lobe. RIGHT lung
base clear.

Hepatobiliary: Gallbladder and liver normal appearance

Pancreas: Normal appearance

Spleen: Normal appearance

Adrenals/Urinary Tract: Adrenal glands, LEFT kidney, and LEFT ureter
normal appearance. RIGHT nephrostomy tube present. No definite RIGHT
renal mass or hydronephrosis. RIGHT ureter decompressed without
identified urinary tract calcification. Bladder unremarkable.

Stomach/Bowel: Normal appendix. Diverticulosis of sigmoid colon
without evidence of diverticulitis. Few ascending colonic
diverticula. And remaining bowel loops unremarkable.

Vascular/Lymphatic: Atherosclerotic calcifications aorta without
aneurysm. No adenopathy.

Reproductive: Uterus surgically absent. Nonvisualization of ovaries.

Other: No free air or free fluid. Minimal stranding in small-bowel
mesentery question mint fibrosing mesenteritis unchanged. No hernia
or acute inflammatory process.

Musculoskeletal: Bones demineralized with degenerative disc disease
changes lumbar spine.
IMPRESSION: RIGHT nephrostomy tube without hydronephrosis, hydroureter, or
adjacent fluid collection.

Colonic diverticulosis without evidence of diverticulitis.

No acute intra-abdominal or intrapelvic abnormalities.

Aortic Atherosclerosis ([id number]).

## 2022-01-26 IMAGING — DX DG CHEST 1V PORT
1 series · 1 of 1 positions shown · non-contrast
Comparison: None.

CLINICAL DATA: Fever, short of breath, weakness

EXAM:
PORTABLE CHEST 1 VIEW

[chest ap]
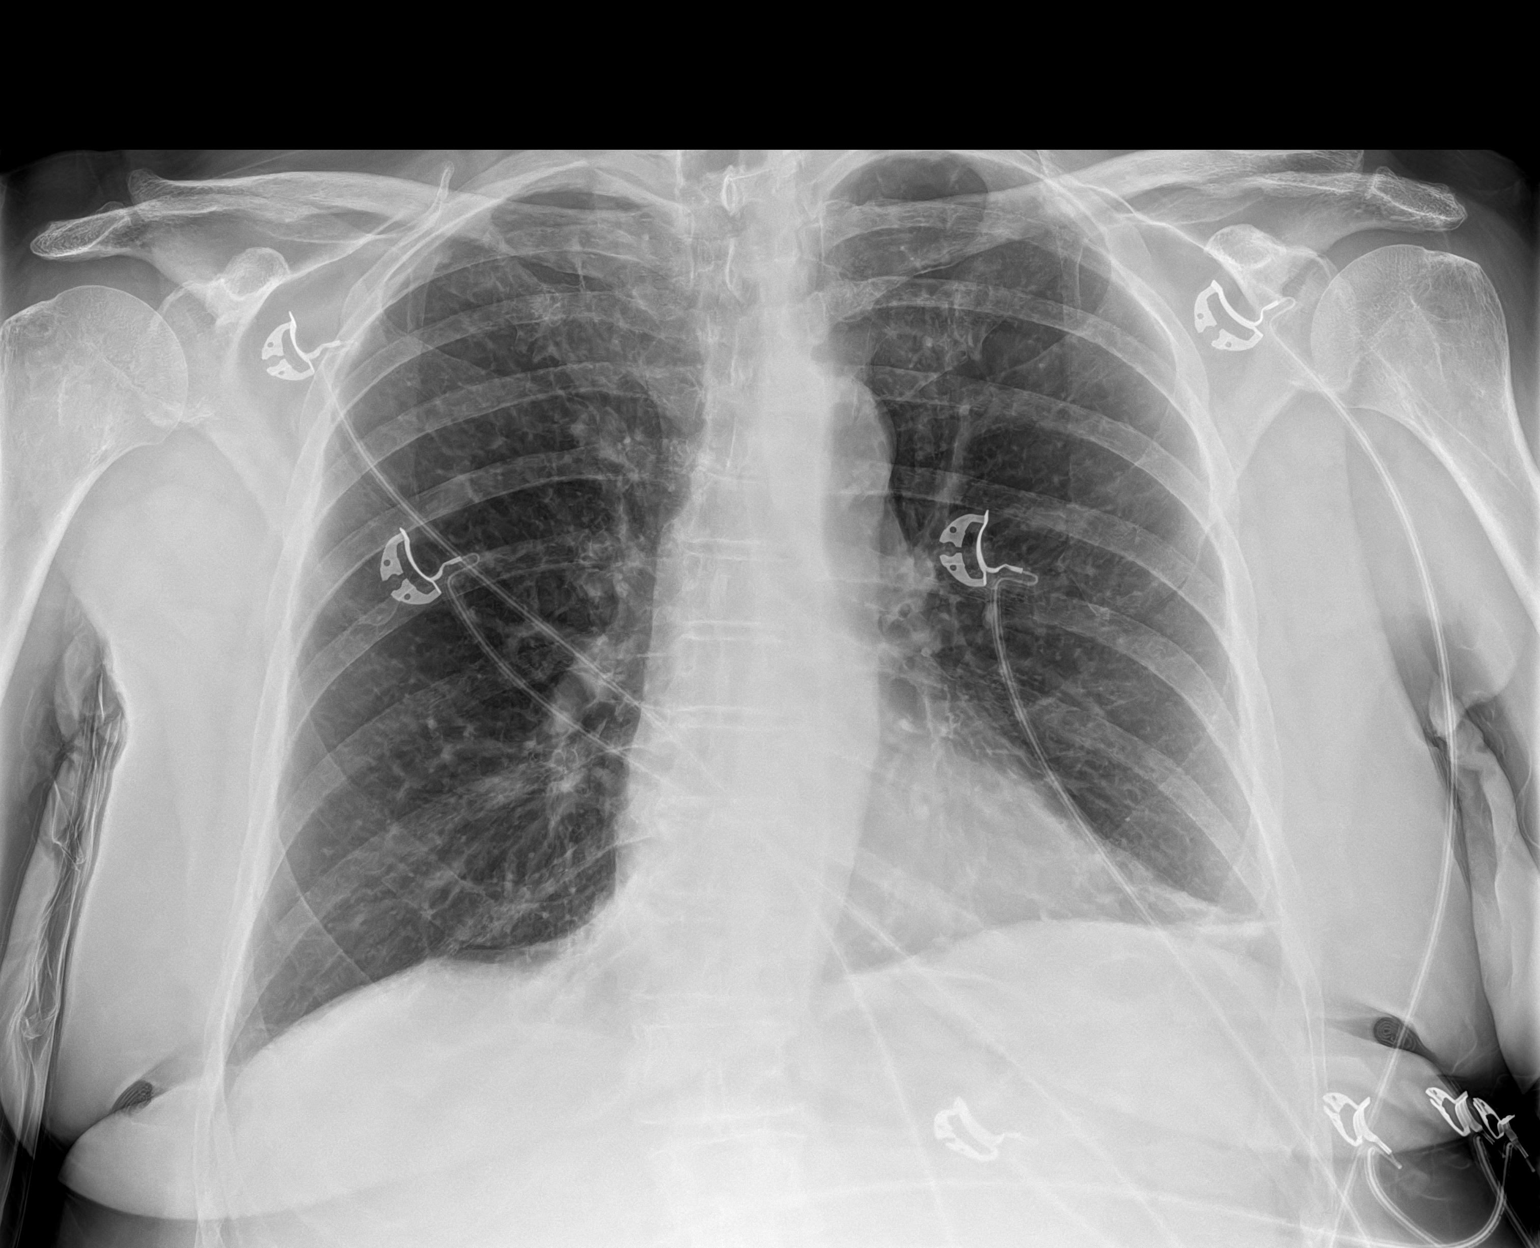

[1 of 1 positions shown; findings below may reference images not displayed]

FINDINGS: The heart size and mediastinal contours are within normal limits.
Both lungs are clear. The visualized skeletal structures are
unremarkable.
IMPRESSION: No active disease.

## 2022-02-19 ENCOUNTER — Ambulatory Visit (HOSPITAL_COMMUNITY)
Admission: RE | Admit: 2022-02-19 | Discharge: 2022-02-19 | Disposition: A | Discharge status: Home / Self Care | Payer: Medicare Other | Source: Ambulatory Visit | Attending: Otolaryngology | Admitting: Otolaryngology

## 2022-02-19 ENCOUNTER — Encounter (HOSPITAL_COMMUNITY): Payer: Self-pay

## 2022-02-19 DIAGNOSIS — E041 Nontoxic single thyroid nodule: Secondary | ICD-10-CM

## 2022-02-25 ENCOUNTER — Ambulatory Visit (HOSPITAL_COMMUNITY)
Admission: RE | Admit: 2022-02-25 | Discharge: 2022-02-25 | Disposition: A | Discharge status: Home / Self Care | Payer: Medicare Other | Source: Ambulatory Visit | Attending: Urology | Admitting: Urology

## 2022-02-25 DIAGNOSIS — N1330 Unspecified hydronephrosis: Secondary | ICD-10-CM | POA: Diagnosis not present

## 2022-02-25 DIAGNOSIS — N281 Cyst of kidney, acquired: Secondary | ICD-10-CM | POA: Diagnosis not present

## 2022-02-25 DIAGNOSIS — N131 Hydronephrosis with ureteral stricture, not elsewhere classified: Secondary | ICD-10-CM | POA: Diagnosis not present

## 2022-02-26 ENCOUNTER — Ambulatory Visit: Payer: Medicare Other | Admitting: Urology

## 2022-02-26 ENCOUNTER — Encounter: Payer: Self-pay | Admitting: Urology

## 2022-02-26 VITALS — BP 149/72 | HR 67

## 2022-02-26 DIAGNOSIS — N131 Hydronephrosis with ureteral stricture, not elsewhere classified: Secondary | ICD-10-CM | POA: Diagnosis not present

## 2022-02-26 LAB — URINALYSIS, ROUTINE W REFLEX MICROSCOPIC
Bilirubin, UA: NEGATIVE
Glucose, UA: NEGATIVE
Ketones, UA: NEGATIVE
Leukocytes,UA: NEGATIVE
Nitrite, UA: NEGATIVE
Protein,UA: NEGATIVE
RBC, UA: NEGATIVE
Specific Gravity, UA: 1.01 (ref 1.005–1.030)
Urobilinogen, Ur: 0.2 mg/dL (ref 0.2–1.0)
pH, UA: 7 (ref 5.0–7.5)

## 2022-02-26 NOTE — Patient Instructions (Signed)
Hydronephrosis  Hydronephrosis is the swelling of one or both kidneys due to a blockage that stops urine from flowing out of the body. Kidneys filter waste from the blood and produce urine. This condition can lead to kidney failure and may become life-threatening if not treated promptly. What are the causes? In infants and children, common causes include problems that occur when a baby is developing in the womb. These can include problems in the kidneys or in the tubes that drain urine into the bladder (ureters). In adults, common causes include: Kidney stones. Pregnancy. A tumor or cyst in the abdomen or pelvis. An enlarged prostate gland. Other causes include: Bladder infection. Scar tissue from a previous surgery or injury. A blood clot. Cancer of the prostate, bladder, uterus, ovary, or colon. What are the signs or symptoms? Symptoms of this condition include: Pain or discomfort in your side (flank) or abdomen. Swelling in your abdomen. Nausea and vomiting. Fever. Pain when passing urine. Feelings of urgency when you need to urinate. Urinating more often than normal. In some cases, you may not have any symptoms. How is this diagnosed? This condition may be diagnosed based on: Your symptoms and medical history. A physical exam. Blood and urine tests. Imaging tests, such as an ultrasound, CT scan, or MRI. A procedure to look at your urinary tract and bladder by inserting a scope into the urethra (cystoscopy). How is this treated? Treatment for this condition depends on where the blockage is, how long it has been there, and what caused it. The goal of treatment is to remove the blockage. Treatment may include: Antibiotic medicines to treat or prevent infection. A procedure to place a small, thin tube (stent) into a blocked ureter. The stent will keep the ureter open so that urine can drain through it. A nonsurgical procedure that crushes kidney stones with shock waves  (extracorporeal shock wave lithotripsy). If kidney failure occurs, treatment may include dialysis or a kidney transplant. Follow these instructions at home:  Take over-the-counter and prescription medicines only as told by your health care provider. If you were prescribed an antibiotic medicine, take it exactly as told by your health care provider. Do not stop taking the antibiotic even if you start to feel better. Rest and return to your normal activities as told by your health care provider. Ask your health care provider what activities are safe for you. Drink enough fluid to keep your urine pale yellow. Keep all follow-up visits. This is important. Contact a health care provider if: You continue to have symptoms after treatment. You develop new symptoms. Your urine becomes cloudy or bloody. You have a fever. Get help right away if: You have severe flank or abdominal pain. You cannot drink fluids without vomiting. Summary Hydronephrosis is the swelling of one or both kidneys due to a blockage that stops urine from flowing out of the body. Hydronephrosis can lead to kidney failure and may become life-threatening if not treated promptly. The goal of treatment is to remove the blockage. It may include a procedure to insert a stent into a blocked ureter, a procedure to break up kidney stones, or taking antibiotic medicines. Follow your health care provider's instructions for taking care of yourself at home, including instructions about drinking fluids, taking medicines, and limiting activities. This information is not intended to replace advice given to you by your health care provider. Make sure you discuss any questions you have with your health care provider. Document Revised: 12/25/2019 Document Reviewed: 12/25/2019 Elsevier   Patient Education  2023 [employer]  

## 2022-02-26 NOTE — Progress Notes (Signed)
02/26/2022 11:07 AM   Amanda Case 06/18/1946 [id number]  Referring provider: Berenice Primas [address] [place],  [place] [address]  Right hydronephrosis  HPI: Amanda Case is a 76yo here for followup for right hydronephrosis. She denies any right flank pian. She denies any LUTS. Renal US from yesterday shows no right hydronephrosis and no obvious right renal calculus   PMH: Past Medical History:  Diagnosis Date   History of kidney stones    Hypercholesterolemia    Hypertension    Kidney stone     Surgical History: Past Surgical History:  Procedure Laterality Date   ABDOMINAL HYSTERECTOMY     CYSTOSCOPY N/A 03/12/2021   Procedure: CYSTOSCOPY WITH FLUOROSCOPY;  Surgeon: Cleon Gustin, MD;  Location: [facility];  Service: Urology;  Laterality: N/A;   CYSTOSCOPY WITH RETROGRADE PYELOGRAM, URETEROSCOPY AND STENT PLACEMENT Right 12/19/2020   Procedure: CYSTOSCOPY WITH RETROGRADE PYELOGRAM, URETEROSCOPY DIAGNOSTIC;  Surgeon: Cleon Gustin, MD;  Location: [facility];  Service: Urology;  Laterality: Right;   IR NEPHROSTOMY PLACEMENT RIGHT  12/23/2020   IR NEPHROSTOMY TUBE CHANGE  02/17/2021   URETERAL REIMPLANTION Right 03/12/2021   Procedure: URETERAL REIMPLANT;  Surgeon: Cleon Gustin, MD;  Location: [facility];  Service: Urology;  Laterality: Right;    Home Medications:  Allergies as of 02/26/2022       Reactions   Cipro [ciprofloxacin-ciproflox Hcl Er] Hives        Medication List        Accurate as of February 26, 2022 11:07 AM. If you have any questions, ask your nurse or doctor.          STOP taking these medications    sulfamethoxazole-trimethoprim 800-160 MG tablet Commonly known as: BACTRIM DS       TAKE these medications    aspirin EC 81 MG tablet Take 81 mg by mouth daily. Swallow whole.   cholecalciferol 25 MCG (1000 UNIT) tablet Commonly known as: VITAMIN D3 Take 1,000 Units by mouth daily.   hydrochlorothiazide 12.5 MG capsule Commonly known  as: MICROZIDE Take 12.5 mg by mouth daily.   losartan 100 MG tablet Commonly known as: COZAAR Take 100 mg by mouth daily.   rosuvastatin 10 MG tablet Commonly known as: CRESTOR Take 10 mg by mouth at bedtime.   vitamin B-12 500 MCG tablet Commonly known as: CYANOCOBALAMIN Take 500 mcg by mouth daily.        Allergies:  Allergies  Allergen Reactions   Cipro [Ciprofloxacin-Ciproflox Hcl Er] Hives    Family History: No family history on file.  Social History:  reports that she has never smoked. She has never used smokeless tobacco. She reports that she does not drink alcohol and does not use drugs.  ROS: All other review of systems were reviewed and are negative except what is noted above in HPI  Physical Exam: BP (!) 149/72   Pulse 67   Constitutional:  Alert and oriented, No acute distress. HEENT: [place] AT, moist mucus membranes.  Trachea midline, no masses. Cardiovascular: No clubbing, cyanosis, or edema. Respiratory: Normal respiratory effort, no increased work of breathing. GI: Abdomen is soft, nontender, nondistended, no abdominal masses GU: No CVA tenderness.  Lymph: No cervical or inguinal lymphadenopathy. Skin: No rashes, bruises or suspicious lesions. Neurologic: Grossly intact, no focal deficits, moving all 4 extremities. Psychiatric: Normal mood and affect.  Laboratory Data: Lab Results  Component Value Date   WBC 12.0 (H) 03/13/2021   HGB 11.1 (L) 03/13/2021  HCT 34.7 (L) 03/13/2021   MCV 94.0 03/13/2021   PLT 241 03/13/2021    Lab Results  Component Value Date   CREATININE 1.15 (H) 03/13/2021    No results found for: "PSA"  No results found for: "TESTOSTERONE"  No results found for: "HGBA1C"  Urinalysis    Component Value Date/Time   COLORURINE YELLOW 02/18/2021 2353   APPEARANCEUR Clear 08/28/2021 1234   LABSPEC 1.010 02/18/2021 2353   PHURINE 6.0 02/18/2021 2353   GLUCOSEU Negative 08/28/2021 1234   HGBUR MODERATE (A) 02/18/2021  2353   BILIRUBINUR Negative 08/28/2021 [place] 02/18/2021 2353   PROTEINUR Negative 08/28/2021 1234   PROTEINUR 100 (A) 02/18/2021 2353   UROBILINOGEN negative (A) 01/02/2020 0913   NITRITE Negative 08/28/2021 1234   NITRITE NEGATIVE 02/18/2021 2353   LEUKOCYTESUR Negative 08/28/2021 1234   LEUKOCYTESUR LARGE (A) 02/18/2021 2353    Lab Results  Component Value Date   LABMICR See below: 08/28/2021   WBCUA 0-5 08/28/2021   LABEPIT 0-10 08/28/2021   BACTERIA None seen 08/28/2021    Pertinent Imaging: Renal US yesterday: Images reviewed and discussed with the patient No results found for this or any previous visit.  No results found for this or any previous visit.  No results found for this or any previous visit.  No results found for this or any previous visit.  Results for orders placed during the hospital encounter of 08/21/21  Ultrasound renal complete  Narrative CLINICAL DATA:  Follow-up right-sided hydronephrosis  EXAM: RENAL / URINARY TRACT ULTRASOUND COMPLETE  COMPARISON:  05/19/2021  FINDINGS: Right Kidney:  Renal measurements: 8.9 x 3.8 x 4.2 cm. = volume: 96 mL. Cortical thinning is noted in the right kidney. Minimal fullness of the renal pelvis is seen although no true hydronephrosis is noted. Proximal ureter is not significantly dilated. Echogenic focus is noted in the midportion of the right kidney measuring 4 mm suggestive of nonobstructing stone.  Left Kidney:  Renal measurements: 9.7 x 5.2 x 5.4 cm. = volume: 42 mL. 1.5 cm peripelvic cyst is noted similar to that seen on the prior exam. No mass lesion or hydronephrosis is noted.  Bladder:  Appears normal for degree of bladder distention. Left ureteral jet is well seen. Right ureteral jet is not well visualized on this exam.  Other:  None.  IMPRESSION: Minimal fullness of the right renal pelvis without definitive hydronephrosis.  Echogenic focus within the  midportion of the right kidney suspicious for 4 mm nonobstructing stone.  Left peripelvic cysts stable from the prior study.   Electronically Signed By: Inez Catalina M.D. On: 08/24/2021 21:10  No results found for this or any previous visit.  Results for orders placed during the hospital encounter of 12/06/19  CT HEMATURIA WORKUP  Narrative CLINICAL DATA:  Chronic UTI for 2 years. Hematuria.  EXAM: CT ABDOMEN AND PELVIS WITHOUT AND WITH CONTRAST  TECHNIQUE: Multidetector CT imaging of the abdomen and pelvis was performed following the standard protocol before and following the bolus administration of intravenous contrast.  CONTRAST:  153m OMNIPAQUE IOHEXOL 300 MG/ML  SOLN  COMPARISON:  No prior images are available for review. Report from 2018 is noted.  FINDINGS: Lower chest: Basilar atelectasis. No effusion.  Hepatobiliary: Mildly lobular hepatic contours. No signs of focal hepatic lesion. Biliary tree is normal. Portal vein is patent.  Pancreas: Pancreas without signs of inflammation, ductal dilation or focal lesion.  Spleen: Spleen is normal size without focal lesion.  Adrenals/Urinary Tract:  Adrenal glands are normal.  Renal enhancement is symmetric. No evidence of hydronephrosis. Urinary bladder displaced by large pelvic mass. No suspicious renal lesion.  Excretory phase with limited assessment of the distal ureters. No secondary signs of stricture. The no lesion in the upper tracts.  No urinary tract calculus.  Stomach/Bowel: Small hiatal hernia. No bowel obstruction or acute bowel process. Normal appendix. Sigmoid diverticulosis.  Vascular/Lymphatic: Atheromatous changes in the abdominal aorta. No aneurysm. No adenopathy in the retroperitoneum or in the upper abdomen. No pelvic nodal enlargement. Hazy central small bowel mesentery with scattered small lymph nodes.  Reproductive: Uterus is not identifiable as a separate structure. There is a  large mass in the pelvis with some subtle peripheral calcification. Lesion does not show infiltrative margins and measures approximately 15.1 x 14.7 x 11.2 cm. The uterus appears to be displaced along the left lateral margin of the mass.  The right and left ovary are likely also displaced but not well defined as discrete structures on the current CT study with engorgement of ovarian veins more so on the right than the left.  Other: No ascites.  Musculoskeletal: Spinal degenerative changes without acute or destructive bone finding.  IMPRESSION: 1. 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 There are no infiltrative margins aside from the inability to separate discrete pelvic reproductive structures from this process. Also the absence of adenopathy is reassuring. There is an ultrasound report available from 2007 that also references a mass. Correlate with any surgeries in the interim and with follow-up pelvic imaging with ultrasound or MRI as warranted. 2. No evidence of urinary tract calculus or obstruction. No signs of upper tract lesion to explain hematuria though the mid right ureter and distal right ureter show limited assessment due to lack of opacification. 3. Mildly lobular hepatic contours may reflect underlying cirrhosis. No focal hepatic lesion. 4. Sigmoid diverticulosis. 5. Small hiatal hernia. 6. Aortic atherosclerosis.  Aortic Atherosclerosis (ICD10-I70.0).   Electronically Signed By: Zetta Bills M.D. On: 12/07/2019 09:09  Results for orders placed during the hospital encounter of 02/18/21  CT Renal Stone Study  Narrative CLINICAL DATA:  Flank pain, kidney stone, fever, recent nephrostomy tube change  EXAM: CT ABDOMEN AND PELVIS WITHOUT CONTRAST  TECHNIQUE: Multidetector CT imaging of the abdomen and pelvis was performed following the standard protocol without IV contrast. Sagittal  and coronal MPR images reconstructed from axial data set.  COMPARISON:  12/12/2020  FINDINGS: Lower chest: Subsegmental atelectasis LEFT lower lobe. RIGHT lung base clear.  Hepatobiliary: Gallbladder and liver normal appearance  Pancreas: Normal appearance  Spleen: Normal appearance  Adrenals/Urinary Tract: Adrenal glands, LEFT kidney, and LEFT ureter normal appearance. RIGHT nephrostomy tube present. No definite RIGHT renal mass or hydronephrosis. RIGHT ureter decompressed without identified urinary tract calcification. Bladder unremarkable.  Stomach/Bowel: Normal appendix. Diverticulosis of sigmoid colon without evidence of diverticulitis. Few ascending colonic diverticula. And remaining bowel loops unremarkable.  Vascular/Lymphatic: Atherosclerotic calcifications aorta without aneurysm. No adenopathy.  Reproductive: Uterus surgically absent. Nonvisualization of ovaries.  Other: No free air or free fluid. Minimal stranding in small-bowel mesentery question mint fibrosing mesenteritis unchanged. No hernia or acute inflammatory process.  Musculoskeletal: Bones demineralized with degenerative disc disease changes lumbar spine.  IMPRESSION: RIGHT nephrostomy tube without hydronephrosis, hydroureter, or adjacent fluid collection.  Colonic diverticulosis without evidence of diverticulitis.  No acute intra-abdominal or intrapelvic abnormalities.  Aortic Atherosclerosis (ICD10-I70.0).   Electronically Signed By: Elta Gu[place]oupe  Thornton Papas M.D. On: 02/18/2021 22:00   Assessment & Plan:    1. Hydronephrosis with ureteral stricture, not elsewhere classified -RTC 6 months with renal US  - Urinalysis, Routine w reflex microscopic   No follow-ups on file.  Nicolette Bang, MD  [facility] [place]

## 2022-03-30 DIAGNOSIS — I700 Atherosclerosis of aorta: Secondary | ICD-10-CM | POA: Diagnosis not present

## 2022-03-30 DIAGNOSIS — Z79899 Other long term (current) drug therapy: Secondary | ICD-10-CM | POA: Diagnosis not present

## 2022-03-30 DIAGNOSIS — Z7189 Other specified counseling: Secondary | ICD-10-CM | POA: Diagnosis not present

## 2022-03-30 DIAGNOSIS — E7800 Pure hypercholesterolemia, unspecified: Secondary | ICD-10-CM | POA: Diagnosis not present

## 2022-03-30 DIAGNOSIS — R5383 Other fatigue: Secondary | ICD-10-CM | POA: Diagnosis not present

## 2022-03-30 DIAGNOSIS — Z0000 Encounter for general adult medical examination without abnormal findings: Secondary | ICD-10-CM | POA: Diagnosis not present

## 2022-03-30 DIAGNOSIS — N183 Chronic kidney disease, stage 3 unspecified: Secondary | ICD-10-CM | POA: Diagnosis not present

## 2022-03-30 DIAGNOSIS — I10 Essential (primary) hypertension: Secondary | ICD-10-CM | POA: Diagnosis not present

## 2022-03-30 DIAGNOSIS — Z299 Encounter for prophylactic measures, unspecified: Secondary | ICD-10-CM | POA: Diagnosis not present

## 2022-04-05 ENCOUNTER — Other Ambulatory Visit (HOSPITAL_COMMUNITY): Payer: Self-pay | Admitting: Otolaryngology

## 2022-04-05 DIAGNOSIS — E041 Nontoxic single thyroid nodule: Secondary | ICD-10-CM

## 2022-04-13 ENCOUNTER — Ambulatory Visit (HOSPITAL_COMMUNITY)
Admission: RE | Admit: 2022-04-13 | Discharge: 2022-04-13 | Disposition: A | Discharge status: Home / Self Care | Payer: Medicare Other | Source: Ambulatory Visit | Attending: Otolaryngology | Admitting: Otolaryngology

## 2022-04-13 DIAGNOSIS — E041 Nontoxic single thyroid nodule: Secondary | ICD-10-CM | POA: Diagnosis not present

## 2022-04-13 DIAGNOSIS — E042 Nontoxic multinodular goiter: Secondary | ICD-10-CM | POA: Diagnosis not present

## 2022-04-23 DIAGNOSIS — Z79899 Other long term (current) drug therapy: Secondary | ICD-10-CM | POA: Diagnosis not present

## 2022-06-21 DIAGNOSIS — M1712 Unilateral primary osteoarthritis, left knee: Secondary | ICD-10-CM | POA: Diagnosis not present

## 2022-08-19 ENCOUNTER — Ambulatory Visit (HOSPITAL_COMMUNITY)
Admission: RE | Admit: 2022-08-19 | Discharge: 2022-08-19 | Disposition: A | Discharge status: Home / Self Care | Payer: Medicare Other | Source: Ambulatory Visit | Attending: Urology | Admitting: Urology

## 2022-08-19 DIAGNOSIS — N131 Hydronephrosis with ureteral stricture, not elsewhere classified: Secondary | ICD-10-CM | POA: Insufficient documentation

## 2022-08-19 DIAGNOSIS — N1330 Unspecified hydronephrosis: Secondary | ICD-10-CM | POA: Diagnosis not present

## 2022-08-19 DIAGNOSIS — N281 Cyst of kidney, acquired: Secondary | ICD-10-CM | POA: Diagnosis not present

## 2022-08-24 ENCOUNTER — Ambulatory Visit (INDEPENDENT_AMBULATORY_CARE_PROVIDER_SITE_OTHER): Payer: Medicare Other | Admitting: Urology

## 2022-08-24 ENCOUNTER — Ambulatory Visit: Payer: Medicare Other | Admitting: Urology

## 2022-08-24 ENCOUNTER — Encounter: Payer: Self-pay | Admitting: Urology

## 2022-08-24 VITALS — BP 150/82 | HR 80

## 2022-08-24 DIAGNOSIS — N131 Hydronephrosis with ureteral stricture, not elsewhere classified: Secondary | ICD-10-CM | POA: Diagnosis not present

## 2022-08-24 LAB — URINALYSIS, ROUTINE W REFLEX MICROSCOPIC
Bilirubin, UA: NEGATIVE
Glucose, UA: NEGATIVE
Ketones, UA: NEGATIVE
Leukocytes,UA: NEGATIVE
Nitrite, UA: NEGATIVE
Protein,UA: NEGATIVE
RBC, UA: NEGATIVE
Specific Gravity, UA: <1.005 — ABNORMAL LOW (ref 1.005–1.030)
Urobilinogen, Ur: 0.2 mg/dL (ref 0.2–1.0)
pH, UA: 5.5 (ref 5.0–7.5)

## 2022-08-24 NOTE — Patient Instructions (Signed)
Hydronephrosis  Hydronephrosis is the swelling of one or both kidneys due to a blockage that stops urine from flowing out of the body. Kidneys filter waste from the blood and produce urine. This condition can lead to kidney failure and may become life-threatening if not treated promptly. What are the causes? In infants and children, common causes include problems that occur when a baby is developing in the womb. These can include problems in the kidneys or in the tubes that drain urine into the bladder (ureters). In adults, common causes include: Kidney stones. Pregnancy. A tumor or cyst in the abdomen or pelvis. An enlarged prostate gland. Other causes include: Bladder infection. Scar tissue from a previous surgery or injury. A blood clot. Cancer of the prostate, bladder, uterus, ovary, or colon. What are the signs or symptoms? Symptoms of this condition include: Pain or discomfort in your side (flank) or abdomen. Swelling in your abdomen. Nausea and vomiting. Fever. Pain when passing urine. Feelings of urgency when you need to urinate. Urinating more often than normal. In some cases, you may not have any symptoms. How is this diagnosed? This condition may be diagnosed based on: Your symptoms and medical history. A physical exam. Blood and urine tests. Imaging tests, such as an ultrasound, CT scan, or MRI. A procedure to look at your urinary tract and bladder by inserting a scope into the urethra (cystoscopy). How is this treated? Treatment for this condition depends on where the blockage is, how long it has been there, and what caused it. The goal of treatment is to remove the blockage. Treatment may include: Antibiotic medicines to treat or prevent infection. A procedure to place a small, thin tube (stent) into a blocked ureter. The stent will keep the ureter open so that urine can drain through it. A nonsurgical procedure that crushes kidney stones with shock waves  (extracorporeal shock wave lithotripsy). If kidney failure occurs, treatment may include dialysis or a kidney transplant. Follow these instructions at home:  Take over-the-counter and prescription medicines only as told by your health care provider. If you were prescribed an antibiotic medicine, take it exactly as told by your health care provider. Do not stop taking the antibiotic even if you start to feel better. Rest and return to your normal activities as told by your health care provider. Ask your health care provider what activities are safe for you. Drink enough fluid to keep your urine pale yellow. Keep all follow-up visits. This is important. Contact a health care provider if: You continue to have symptoms after treatment. You develop new symptoms. Your urine becomes cloudy or bloody. You have a fever. Get help right away if: You have severe flank or abdominal pain. You cannot drink fluids without vomiting. Summary Hydronephrosis is the swelling of one or both kidneys due to a blockage that stops urine from flowing out of the body. Hydronephrosis can lead to kidney failure and may become life-threatening if not treated promptly. The goal of treatment is to remove the blockage. It may include a procedure to insert a stent into a blocked ureter, a procedure to break up kidney stones, or taking antibiotic medicines. Follow your health care provider's instructions for taking care of yourself at home, including instructions about drinking fluids, taking medicines, and limiting activities. This information is not intended to replace advice given to you by your health care provider. Make sure you discuss any questions you have with your health care provider. Document Revised: 12/25/2019 Document Reviewed: 12/25/2019 Elsevier   Patient Education  2023 [employer]  

## 2022-08-24 NOTE — Progress Notes (Signed)
08/24/2022 2:36 PM   Amanda Case March 27, 1946 [id number]  Referring provider: Berenice Primas [address] [place],  [place] [address]  Followup right hydronephrosis   HPI: Amanda Case is a 76yo here for followup for right ureteral stricture/right hydronephrosis. Renal US 11/30 shows mild right renal fullness. She denies nay flank pain. She denies any LUTS. No other complaints today   PMH: Past Medical History:  Diagnosis Date   History of kidney stones    Hypercholesterolemia    Hypertension    Kidney stone     Surgical History: Past Surgical History:  Procedure Laterality Date   ABDOMINAL HYSTERECTOMY     CYSTOSCOPY N/A 03/12/2021   Procedure: CYSTOSCOPY WITH FLUOROSCOPY;  Surgeon: Cleon Gustin, MD;  Location: [facility];  Service: Urology;  Laterality: N/A;   CYSTOSCOPY WITH RETROGRADE PYELOGRAM, URETEROSCOPY AND STENT PLACEMENT Right 12/19/2020   Procedure: CYSTOSCOPY WITH RETROGRADE PYELOGRAM, URETEROSCOPY DIAGNOSTIC;  Surgeon: Cleon Gustin, MD;  Location: [facility];  Service: Urology;  Laterality: Right;   IR NEPHROSTOMY PLACEMENT RIGHT  12/23/2020   IR NEPHROSTOMY TUBE CHANGE  02/17/2021   URETERAL REIMPLANTION Right 03/12/2021   Procedure: URETERAL REIMPLANT;  Surgeon: Cleon Gustin, MD;  Location: [facility];  Service: Urology;  Laterality: Right;    Home Medications:  Allergies as of 08/24/2022       Reactions   Cipro [ciprofloxacin-ciproflox Hcl Er] Hives        Medication List        Accurate as of August 24, 2022  2:36 PM. If you have any questions, ask your nurse or doctor.          aspirin EC 81 MG tablet Take 81 mg by mouth daily. Swallow whole.   cholecalciferol 25 MCG (1000 UNIT) tablet Commonly known as: VITAMIN D3 Take 1,000 Units by mouth daily.   hydrochlorothiazide 12.5 MG capsule Commonly known as: MICROZIDE Take 12.5 mg by mouth daily.   losartan 100 MG tablet Commonly known as: COZAAR Take 100 mg by mouth daily.    rosuvastatin 10 MG tablet Commonly known as: CRESTOR Take 10 mg by mouth at bedtime.   vitamin B-12 500 MCG tablet Commonly known as: CYANOCOBALAMIN Take 500 mcg by mouth daily.        Allergies:  Allergies  Allergen Reactions   Cipro [Ciprofloxacin-Ciproflox Hcl Er] Hives    Family History: No family history on file.  Social History:  reports that she has never smoked. She has never used smokeless tobacco. She reports that she does not drink alcohol and does not use drugs.  ROS: All other review of systems were reviewed and are negative except what is noted above in HPI  Physical Exam: BP (!) 150/82   Pulse 80   Constitutional:  Alert and oriented, No acute distress. HEENT: [place] AT, moist mucus membranes.  Trachea midline, no masses. Cardiovascular: No clubbing, cyanosis, or edema. Respiratory: Normal respiratory effort, no increased work of breathing. GI: Abdomen is soft, nontender, nondistended, no abdominal masses GU: No CVA tenderness.  Lymph: No cervical or inguinal lymphadenopathy. Skin: No rashes, bruises or suspicious lesions. Neurologic: Grossly intact, no focal deficits, moving all 4 extremities. Psychiatric: Normal mood and affect.  Laboratory Data: Lab Results  Component Value Date   WBC 12.0 (H) 03/13/2021   HGB 11.1 (L) 03/13/2021   HCT 34.7 (L) 03/13/2021   MCV 94.0 03/13/2021   PLT 241 03/13/2021    Lab Results  Component Value Date  CREATININE 1.15 (H) 03/13/2021    No results found for: "PSA"  No results found for: "TESTOSTERONE"  No results found for: "HGBA1C"  Urinalysis    Component Value Date/Time   COLORURINE YELLOW 02/18/2021 2353   APPEARANCEUR Clear 02/26/2022 1121   LABSPEC 1.010 02/18/2021 2353   PHURINE 6.0 02/18/2021 2353   GLUCOSEU Negative 02/26/2022 1121   HGBUR MODERATE (A) 02/18/2021 2353   BILIRUBINUR Negative 02/26/2022 [place] 02/18/2021 2353   PROTEINUR Negative 02/26/2022 1121    PROTEINUR 100 (A) 02/18/2021 2353   UROBILINOGEN negative (A) 01/02/2020 0913   NITRITE Negative 02/26/2022 1121   NITRITE NEGATIVE 02/18/2021 2353   LEUKOCYTESUR Negative 02/26/2022 1121   LEUKOCYTESUR LARGE (A) 02/18/2021 2353    Lab Results  Component Value Date   LABMICR Comment 02/26/2022   WBCUA 0-5 08/28/2021   LABEPIT 0-10 08/28/2021   BACTERIA None seen 08/28/2021    Pertinent Imaging: Renal US 08/19/2022: Images reviewed and discussed with the patient  No results found for this or any previous visit.  No results found for this or any previous visit.  No results found for this or any previous visit.  No results found for this or any previous visit.  Results for orders placed during the hospital encounter of 08/19/22  Ultrasound renal complete  Narrative CLINICAL DATA:  Follow up hydronephrosis  EXAM: RENAL / URINARY TRACT ULTRASOUND COMPLETE  COMPARISON:  02/25/2022  FINDINGS: Right Kidney:  Length: 9.8 cm. There is renal cortical thinning consistent with scarring. Echogenicity within normal limits. No mass. No hydronephrosis identified. Renal pelvis slightly prominent.  Left Kidney:  Length: 11.3 cm. Echogenicity within normal limits. A 1.2 cm parapelvic midpole cyst noted.  Bladder:  Appears normal for degree of bladder distention.  IMPRESSION: 1. Right kidney is smaller with cortical thinning. This asymmetric appearance could indicate underlying renal artery stenosis. 2. Tiny cyst on the left. 3. No hydronephrosis identified.  Minimal right-sided pelviectasis.   Electronically Signed By: Sammie Bench M.D. On: 08/19/2022 11:07  No valid procedures specified. Results for orders placed during the hospital encounter of 12/06/19  CT HEMATURIA WORKUP  Narrative CLINICAL DATA:  Chronic UTI for 2 years. Hematuria.  EXAM: CT ABDOMEN AND PELVIS WITHOUT AND WITH CONTRAST  TECHNIQUE: Multidetector CT imaging of the abdomen and pelvis  was performed following the standard protocol before and following the bolus administration of intravenous contrast.  CONTRAST:  162m OMNIPAQUE IOHEXOL 300 MG/ML  SOLN  COMPARISON:  No prior images are available for review. Report from 2018 is noted.  FINDINGS: Lower chest: Basilar atelectasis. No effusion.  Hepatobiliary: Mildly lobular hepatic contours. No signs of focal hepatic lesion. Biliary tree is normal. Portal vein is patent.  Pancreas: Pancreas without signs of inflammation, ductal dilation or focal lesion.  Spleen: Spleen is normal size without focal lesion.  Adrenals/Urinary Tract: Adrenal glands are normal.  Renal enhancement is symmetric. No evidence of hydronephrosis. Urinary bladder displaced by large pelvic mass. No suspicious renal lesion.  Excretory phase with limited assessment of the distal ureters. No secondary signs of stricture. The no lesion in the upper tracts.  No urinary tract calculus.  Stomach/Bowel: Small hiatal hernia. No bowel obstruction or acute bowel process. Normal appendix. Sigmoid diverticulosis.  Vascular/Lymphatic: Atheromatous changes in the abdominal aorta. No aneurysm. No adenopathy in the retroperitoneum or in the upper abdomen. No pelvic nodal enlargement. Hazy central small bowel mesentery with scattered small lymph nodes.  Reproductive: Uterus is not identifiable as  a separate structure. There is a large mass in the pelvis with some subtle peripheral calcification. Lesion does not show infiltrative margins and measures approximately 15.1 x 14.7 x 11.2 cm. The uterus appears to be displaced along the left lateral margin of the mass.  The right and left ovary are likely also displaced but not well defined as discrete structures on the current CT study with engorgement of ovarian veins more so on the right than the left.  Other: No ascites.  Musculoskeletal: Spinal degenerative changes without acute or destructive bone  finding.  IMPRESSION: 1. 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 There are no infiltrative margins aside from the inability to separate discrete pelvic reproductive structures from this process. Also the absence of adenopathy is reassuring. There is an ultrasound report available from 2007 that also references a mass. Correlate with any surgeries in the interim and with follow-up pelvic imaging with ultrasound or MRI as warranted. 2. No evidence of urinary tract calculus or obstruction. No signs of upper tract lesion to explain hematuria though the mid right ureter and distal right ureter show limited assessment due to lack of opacification. 3. Mildly lobular hepatic contours may reflect underlying cirrhosis. No focal hepatic lesion. 4. Sigmoid diverticulosis. 5. Small hiatal hernia. 6. Aortic atherosclerosis.  Aortic Atherosclerosis (ICD10-I70.0).   Electronically Signed By: Zetta Bills M.D. On: 12/07/2019 09:09  Results for orders placed during the hospital encounter of 02/18/21  CT Renal Stone Study  Narrative CLINICAL DATA:  Flank pain, kidney stone, fever, recent nephrostomy tube change  EXAM: CT ABDOMEN AND PELVIS WITHOUT CONTRAST  TECHNIQUE: Multidetector CT imaging of the abdomen and pelvis was performed following the standard protocol without IV contrast. Sagittal and coronal MPR images reconstructed from axial data set.  COMPARISON:  12/12/2020  FINDINGS: Lower chest: Subsegmental atelectasis LEFT lower lobe. RIGHT lung base clear.  Hepatobiliary: Gallbladder and liver normal appearance  Pancreas: Normal appearance  Spleen: Normal appearance  Adrenals/Urinary Tract: Adrenal glands, LEFT kidney, and LEFT ureter normal appearance. RIGHT nephrostomy tube present. No definite RIGHT renal mass or hydronephrosis. RIGHT ureter decompressed without identified urinary tract  calcification. Bladder unremarkable.  Stomach/Bowel: Normal appendix. Diverticulosis of sigmoid colon without evidence of diverticulitis. Few ascending colonic diverticula. And remaining bowel loops unremarkable.  Vascular/Lymphatic: Atherosclerotic calcifications aorta without aneurysm. No adenopathy.  Reproductive: Uterus surgically absent. Nonvisualization of ovaries.  Other: No free air or free fluid. Minimal stranding in small-bowel mesentery question mint fibrosing mesenteritis unchanged. No hernia or acute inflammatory process.  Musculoskeletal: Bones demineralized with degenerative disc disease changes lumbar spine.  IMPRESSION: RIGHT nephrostomy tube without hydronephrosis, hydroureter, or adjacent fluid collection.  Colonic diverticulosis without evidence of diverticulitis.  No acute intra-abdominal or intrapelvic abnormalities.  Aortic Atherosclerosis (ICD10-I70.0).   Electronically Signed By: Lavonia Dana M.D. On: 02/18/2021 22:00   Assessment & Plan:    1. Hydronephrosis with ureteral stricture, not elsewhere classified -followup 1 year with renal US - Urinalysis, Routine w reflex microscopic   No follow-ups on file.  Nicolette Bang, MD  [facility] [place]

## 2022-10-21 DIAGNOSIS — L570 Actinic keratosis: Secondary | ICD-10-CM | POA: Diagnosis not present

## 2022-10-21 DIAGNOSIS — X32XXXA Exposure to sunlight, initial encounter: Secondary | ICD-10-CM | POA: Diagnosis not present

## 2022-11-15 DIAGNOSIS — M1712 Unilateral primary osteoarthritis, left knee: Secondary | ICD-10-CM | POA: Diagnosis not present

## 2022-11-15 DIAGNOSIS — M25562 Pain in left knee: Secondary | ICD-10-CM | POA: Diagnosis not present

## 2023-01-25 DIAGNOSIS — R109 Unspecified abdominal pain: Secondary | ICD-10-CM | POA: Diagnosis not present

## 2023-01-25 DIAGNOSIS — N3289 Other specified disorders of bladder: Secondary | ICD-10-CM | POA: Diagnosis not present

## 2023-01-25 DIAGNOSIS — K6389 Other specified diseases of intestine: Secondary | ICD-10-CM | POA: Diagnosis not present

## 2023-01-25 DIAGNOSIS — E785 Hyperlipidemia, unspecified: Secondary | ICD-10-CM | POA: Diagnosis not present

## 2023-01-25 DIAGNOSIS — K3189 Other diseases of stomach and duodenum: Secondary | ICD-10-CM | POA: Diagnosis not present

## 2023-01-25 DIAGNOSIS — I10 Essential (primary) hypertension: Secondary | ICD-10-CM | POA: Diagnosis not present

## 2023-01-25 DIAGNOSIS — K5792 Diverticulitis of intestine, part unspecified, without perforation or abscess without bleeding: Secondary | ICD-10-CM | POA: Diagnosis not present

## 2023-01-25 DIAGNOSIS — R1010 Upper abdominal pain, unspecified: Secondary | ICD-10-CM | POA: Diagnosis not present

## 2023-01-25 DIAGNOSIS — R112 Nausea with vomiting, unspecified: Secondary | ICD-10-CM | POA: Diagnosis not present

## 2023-01-25 DIAGNOSIS — K469 Unspecified abdominal hernia without obstruction or gangrene: Secondary | ICD-10-CM | POA: Diagnosis not present

## 2023-01-30 NOTE — Progress Notes (Signed)
Referring Provider:*** Primary Care Physician:  Wendall Papa Primary Gastroenterologist:  Dr. Bonnetta Barry chief complaint on file.   HPI:   Amanda Case is a 77 y.o. female presenting today at the request of   Past Medical History:  Diagnosis Date   History of kidney stones    Hypercholesterolemia    Hypertension    Kidney stone     Past Surgical History:  Procedure Laterality Date   ABDOMINAL HYSTERECTOMY     CYSTOSCOPY N/A 03/12/2021   Procedure: CYSTOSCOPY WITH FLUOROSCOPY;  Surgeon: Malen Gauze, MD;  Location: [facility];  Service: Urology;  Laterality: N/A;   CYSTOSCOPY WITH RETROGRADE PYELOGRAM, URETEROSCOPY AND STENT PLACEMENT Right 12/19/2020   Procedure: CYSTOSCOPY WITH RETROGRADE PYELOGRAM, URETEROSCOPY DIAGNOSTIC;  Surgeon: Malen Gauze, MD;  Location: [facility];  Service: Urology;  Laterality: Right;   IR NEPHROSTOMY PLACEMENT RIGHT  12/23/2020   IR NEPHROSTOMY TUBE CHANGE  02/17/2021   URETERAL REIMPLANTION Right 03/12/2021   Procedure: URETERAL REIMPLANT;  Surgeon: Malen Gauze, MD;  Location: [facility];  Service: Urology;  Laterality: Right;    Current Outpatient Medications  Medication Sig Dispense Refill   aspirin EC 81 MG tablet Take 81 mg by mouth daily. Swallow whole.     cholecalciferol (VITAMIN D3) 25 MCG (1000 UNIT) tablet Take 1,000 Units by mouth daily.     hydrochlorothiazide (MICROZIDE) 12.5 MG capsule Take 12.5 mg by mouth daily.     losartan (COZAAR) 100 MG tablet Take 100 mg by mouth daily.     rosuvastatin (CRESTOR) 10 MG tablet Take 10 mg by mouth at bedtime.     vitamin B-12 (CYANOCOBALAMIN) 500 MCG tablet Take 500 mcg by mouth daily.     No current facility-administered medications for this visit.    Allergies as of 02/02/2023 - Review Complete 08/24/2022  Allergen Reaction Noted   Cipro [ciprofloxacin-ciproflox hcl er] Hives 11/14/2019    No family history on file.  Social History   Socioeconomic History   Marital  status: Married    Spouse name: Not on file   Number of children: 2   Years of education: Not on file   Highest education level: Not on file  Occupational History   Occupation: retired  Tobacco Use   Smoking status: Never   Smokeless tobacco: Never  Vaping Use   Vaping Use: Never used  Substance and Sexual Activity   Alcohol use: No   Drug use: No   Sexual activity: Not Currently  Other Topics Concern   Not on file  Social History Narrative   Not on file   Social Determinants of Health   Financial Resource Strain: Not on file  Food Insecurity: Not on file  Transportation Needs: Not on file  Physical Activity: Not on file  Stress: Not on file  Social Connections: Not on file  Intimate Partner Violence: Not on file    Review of Systems: Gen: Denies any fever, chills, fatigue, weight loss, lack of appetite.  CV: Denies chest pain, heart palpitations, peripheral edema, syncope.  Resp: Denies shortness of breath at rest or with exertion. Denies wheezing or cough.  GI: Denies dysphagia or odynophagia. Denies jaundice, hematemesis, fecal incontinence. GU : Denies urinary burning, urinary frequency, urinary hesitancy MS: Denies joint pain, muscle weakness, cramps, or limitation of movement.  Derm: Denies rash, itching, dry skin Psych: Denies depression, anxiety, memory loss, and confusion Heme: Denies bruising, bleeding, and enlarged lymph nodes.  Physical Exam: There  were no vitals taken for this visit. General:   Alert and oriented. Pleasant and cooperative. Well-nourished and well-developed.  Head:  Normocephalic and atraumatic. Eyes:  Without icterus, sclera clear and conjunctiva pink.  Ears:  Normal auditory acuity. Lungs:  Clear to auscultation bilaterally. No wheezes, rales, or rhonchi. No distress.  Heart:  S1, S2 present without murmurs appreciated.  Abdomen:  +BS, soft, non-tender and non-distended. No HSM noted. No guarding or rebound. No masses appreciated.   Rectal:  Deferred  Msk:  Symmetrical without gross deformities. Normal posture. Extremities:  Without edema. Neurologic:  Alert and  oriented x4;  grossly normal neurologically. Skin:  Intact without significant lesions or rashes. Psych:  Alert and cooperative. Normal mood and affect.    Assessment:     Plan:  ***   Ermalinda Memos, PA-C [facility] 02/02/2023

## 2023-02-02 ENCOUNTER — Ambulatory Visit: Payer: Medicare Other | Admitting: Gastroenterology

## 2023-02-02 ENCOUNTER — Telehealth: Payer: Self-pay | Admitting: *Deleted

## 2023-02-02 ENCOUNTER — Encounter: Payer: Self-pay | Admitting: Gastroenterology

## 2023-02-02 VITALS — BP 132/84 | HR 102 | Temp 98.4°F | Ht 68.0 in | Wt 197.0 lb

## 2023-02-02 DIAGNOSIS — K5732 Diverticulitis of large intestine without perforation or abscess without bleeding: Secondary | ICD-10-CM | POA: Diagnosis not present

## 2023-02-02 NOTE — Telephone Encounter (Signed)
Pt needing TCS with Dr. Jena Gauss ASA 2 in 6+ weeks, BMET prior. Will call once we receive July schedule

## 2023-02-02 NOTE — Patient Instructions (Addendum)
We will arrange you to have a colonoscopy in the near future with Dr. Jena Gauss.  Complete your course of antibiotics as prescribed.  You may slowly advance your diet back to normal.  Start Benefiber 2 teaspoons daily.  Monitor for recurrent persistent abdominal pain and let me know if this occurs.  I am going to review your CT with our radiologist at [facility].  Will reach out to you next week with any additional recommendations.  We will follow-up with you in the office after your colonoscopy.  It was nice to meet you today!  Ermalinda Memos, PA-C [facility] Gastroenter[facility]

## 2023-02-10 DIAGNOSIS — J984 Other disorders of lung: Secondary | ICD-10-CM | POA: Diagnosis not present

## 2023-02-10 DIAGNOSIS — I700 Atherosclerosis of aorta: Secondary | ICD-10-CM | POA: Diagnosis not present

## 2023-02-10 DIAGNOSIS — I10 Essential (primary) hypertension: Secondary | ICD-10-CM | POA: Diagnosis not present

## 2023-02-10 DIAGNOSIS — Z299 Encounter for prophylactic measures, unspecified: Secondary | ICD-10-CM | POA: Diagnosis not present

## 2023-02-10 DIAGNOSIS — Z7689 Persons encountering health services in other specified circumstances: Secondary | ICD-10-CM | POA: Diagnosis not present

## 2023-02-10 DIAGNOSIS — D72829 Elevated white blood cell count, unspecified: Secondary | ICD-10-CM | POA: Diagnosis not present

## 2023-02-10 DIAGNOSIS — R0602 Shortness of breath: Secondary | ICD-10-CM | POA: Diagnosis not present

## 2023-02-15 ENCOUNTER — Emergency Department (HOSPITAL_COMMUNITY): Payer: Medicare Other

## 2023-02-15 ENCOUNTER — Other Ambulatory Visit: Payer: Self-pay

## 2023-02-15 ENCOUNTER — Encounter (HOSPITAL_COMMUNITY): Payer: Self-pay | Admitting: Emergency Medicine

## 2023-02-15 ENCOUNTER — Emergency Department (HOSPITAL_COMMUNITY)
Admission: EM | Admit: 2023-02-15 | Discharge: 2023-02-15 | Disposition: A | Discharge status: Home / Self Care | Payer: Medicare Other | Attending: Emergency Medicine | Admitting: Emergency Medicine

## 2023-02-15 DIAGNOSIS — R1084 Generalized abdominal pain: Secondary | ICD-10-CM | POA: Diagnosis not present

## 2023-02-15 DIAGNOSIS — K469 Unspecified abdominal hernia without obstruction or gangrene: Secondary | ICD-10-CM | POA: Insufficient documentation

## 2023-02-15 DIAGNOSIS — I10 Essential (primary) hypertension: Secondary | ICD-10-CM | POA: Diagnosis not present

## 2023-02-15 DIAGNOSIS — K5730 Diverticulosis of large intestine without perforation or abscess without bleeding: Secondary | ICD-10-CM | POA: Diagnosis not present

## 2023-02-15 DIAGNOSIS — Z79899 Other long term (current) drug therapy: Secondary | ICD-10-CM | POA: Diagnosis not present

## 2023-02-15 DIAGNOSIS — Z7982 Long term (current) use of aspirin: Secondary | ICD-10-CM | POA: Diagnosis not present

## 2023-02-15 DIAGNOSIS — N281 Cyst of kidney, acquired: Secondary | ICD-10-CM | POA: Diagnosis not present

## 2023-02-15 DIAGNOSIS — K439 Ventral hernia without obstruction or gangrene: Secondary | ICD-10-CM

## 2023-02-15 LAB — URINALYSIS, ROUTINE W REFLEX MICROSCOPIC
Bilirubin Urine: NEGATIVE
Glucose, UA: NEGATIVE mg/dL
Hgb urine dipstick: NEGATIVE
Ketones, ur: NEGATIVE mg/dL
Leukocytes,Ua: NEGATIVE
Nitrite: NEGATIVE
Protein, ur: 30 mg/dL — AB
Specific Gravity, Urine: 1.024 (ref 1.005–1.030)
pH: 5 (ref 5.0–8.0)

## 2023-02-15 LAB — CBC WITH DIFFERENTIAL/PLATELET
Abs Immature Granulocytes: 0.06 10*3/uL (ref 0.00–0.07)
Basophils Absolute: 0 10*3/uL (ref 0.0–0.1)
Basophils Relative: 0 %
Eosinophils Absolute: 0 10*3/uL (ref 0.0–0.5)
Eosinophils Relative: 0 %
HCT: 45.6 % (ref 36.0–46.0)
Hemoglobin: 15 g/dL (ref 12.0–15.0)
Immature Granulocytes: 0 %
Lymphocytes Relative: 4 %
Lymphs Abs: 0.7 10*3/uL (ref 0.7–4.0)
MCH: 30.2 pg (ref 26.0–34.0)
MCHC: 32.9 g/dL (ref 30.0–36.0)
MCV: 91.8 fL (ref 80.0–100.0)
Monocytes Absolute: 0.7 10*3/uL (ref 0.1–1.0)
Monocytes Relative: 5 %
Neutro Abs: 14.2 10*3/uL — ABNORMAL HIGH (ref 1.7–7.7)
Neutrophils Relative %: 91 %
Platelets: 296 10*3/uL (ref 150–400)
RBC: 4.97 MIL/uL (ref 3.87–5.11)
RDW: 12.8 % (ref 11.5–15.5)
WBC: 15.6 10*3/uL — ABNORMAL HIGH (ref 4.0–10.5)
nRBC: 0 % (ref 0.0–0.2)

## 2023-02-15 LAB — COMPREHENSIVE METABOLIC PANEL
ALT: 13 U/L (ref 0–44)
AST: 16 U/L (ref 15–41)
Albumin: 4.4 g/dL (ref 3.5–5.0)
Alkaline Phosphatase: 69 U/L (ref 38–126)
Anion gap: 16 — ABNORMAL HIGH (ref 5–15)
BUN: 22 mg/dL (ref 8–23)
CO2: 22 mmol/L (ref 22–32)
Calcium: 10 mg/dL (ref 8.9–10.3)
Chloride: 98 mmol/L (ref 98–111)
Creatinine, Ser: 1.28 mg/dL — ABNORMAL HIGH (ref 0.44–1.00)
GFR, Estimated: 44 mL/min — ABNORMAL LOW (ref >60)
Glucose, Bld: 166 mg/dL — ABNORMAL HIGH (ref 70–99)
Potassium: 3.8 mmol/L (ref 3.5–5.1)
Sodium: 136 mmol/L (ref 135–145)
Total Bilirubin: 1.1 mg/dL (ref 0.3–1.2)
Total Protein: 8.4 g/dL — ABNORMAL HIGH (ref 6.5–8.1)

## 2023-02-15 LAB — LIPASE, BLOOD: Lipase: 36 U/L (ref 11–51)

## 2023-02-15 LAB — LACTIC ACID, PLASMA
Lactic Acid, Venous: 1.4 mmol/L (ref 0.5–1.9)
Lactic Acid, Venous: 1.6 mmol/L (ref 0.5–1.9)

## 2023-02-15 LAB — MAGNESIUM: Magnesium: 1.7 mg/dL (ref 1.7–2.4)

## 2023-02-15 MED ORDER — LACTATED RINGERS IV BOLUS
500.0000 mL | Freq: Once | INTRAVENOUS | Status: AC
  Administered 2023-02-15: 500 mL via INTRAVENOUS

## 2023-02-15 MED ORDER — ONDANSETRON HCL 4 MG/2ML IJ SOLN
4.0000 mg | Freq: Once | INTRAMUSCULAR | Status: AC
  Administered 2023-02-15: 4 mg via INTRAVENOUS
  Filled 2023-02-15: qty 2

## 2023-02-15 MED ORDER — HYDROMORPHONE HCL 1 MG/ML IJ SOLN
0.5000 mg | Freq: Once | INTRAMUSCULAR | Status: AC
  Administered 2023-02-15: 0.5 mg via INTRAVENOUS
  Filled 2023-02-15: qty 0.5

## 2023-02-15 MED ORDER — IOHEXOL 300 MG/ML  SOLN
75.0000 mL | Freq: Once | INTRAMUSCULAR | Status: AC | PRN
  Administered 2023-02-15: 75 mL via INTRAVENOUS

## 2023-02-15 NOTE — Discharge Instructions (Signed)
See below for information on follow-up with general surgeon, Dr. Lovell Sheehan.  You have an appointment at his office at 9:30 AM on Thursday.  Avoid heavy lifting.  Return to the emergency department for any return of concerning symptoms.

## 2023-02-15 NOTE — ED Notes (Addendum)
[place] sandwich and drink provided for pt per EDP

## 2023-02-15 NOTE — ED Provider Notes (Signed)
Amanda Case EMERGENCY DEPARTMENT AT [facility] Provider Note   CSN: [id number] Arrival date & time: 02/15/23  4098     History  Chief Complaint  Patient presents with   Abdominal Pain   Emesis    Amanda Case is a 77 y.o. female.  HPI Patient presents for abdominal pain.  Medical history includes HLD, HTN, nephrolithiasis, diverticulosis.  Several weeks ago, she was evaluated at outside hospital ED.  She was told that she had diverticulitis and possible partial small bowel obstruction.  She was started on antibiotics, which she completed.  She followed up with gastroenterology.  She did have improved symptoms up until last night.  Last night, she developed generalized abdominal pain, nausea, and vomiting.  She does feel that her abdomen is distended.  Symptoms persisted today.  Last BM was this morning and was diarrhea.    Home Medications Prior to Admission medications   Medication Sig Start Date End Date Taking? Authorizing Provider  aspirin EC 81 MG tablet Take 81 mg by mouth daily. Swallow whole.   Yes [provider]  cholecalciferol (VITAMIN D3) 25 MCG (1000 UNIT) tablet Take 1,000 Units by mouth daily.   Yes [provider]  hydrochlorothiazide (MICROZIDE) 12.5 MG capsule Take 12.5 mg by mouth daily.   Yes [provider]  losartan (COZAAR) 100 MG tablet Take 100 mg by mouth daily.   Yes [provider]  rosuvastatin (CRESTOR) 10 MG tablet Take 10 mg by mouth at bedtime.   Yes [provider]  vitamin B-12 (CYANOCOBALAMIN) 500 MCG tablet Take 500 mcg by mouth daily.   Yes [provider]      Allergies    Cipro [ciprofloxacin-ciproflox hcl er]    Review of Systems   Review of Systems  Constitutional:  Positive for fatigue.  Gastrointestinal:  Positive for abdominal distention, abdominal pain, diarrhea, nausea and vomiting.  All other systems reviewed and are negative.   Physical Exam Updated Vital  Signs BP (!) 132/58   Pulse 92   Temp 98.2 F (36.8 C) (Oral)   Resp 20   SpO2 98%  Physical Exam Vitals and nursing note reviewed.  Constitutional:      General: She is not in acute distress.    Appearance: She is well-developed. She is not toxic-appearing or diaphoretic.  HENT:     Head: Normocephalic and atraumatic.     Mouth/Throat:     Mouth: Mucous membranes are moist.  Eyes:     Conjunctiva/sclera: Conjunctivae normal.  Cardiovascular:     Rate and Rhythm: Normal rate and regular rhythm.  Pulmonary:     Effort: Pulmonary effort is normal. No respiratory distress.  Abdominal:     General: There is distension.     Palpations: Abdomen is soft.     Tenderness: There is generalized abdominal tenderness. There is no guarding or rebound.  Musculoskeletal:        General: No swelling.     Cervical back: Neck supple.  Skin:    General: Skin is warm and dry.     Coloration: Skin is not cyanotic, jaundiced or pale.  Neurological:     General: No focal deficit present.     Mental Status: She is alert and oriented to person, place, and time.  Psychiatric:        Mood and Affect: Mood normal.        Behavior: Behavior normal.     ED Results / Procedures / Treatments  Labs (all labs ordered are listed, but only abnormal results are displayed) Labs Reviewed  COMPREHENSIVE METABOLIC PANEL - Abnormal; Notable for the following components:      Result Value   Glucose, Bld 166 (*)    Creatinine, Ser 1.28 (*)    Total Protein 8.4 (*)    GFR, Estimated 44 (*)    Anion gap 16 (*)    All other components within normal limits  CBC WITH DIFFERENTIAL/PLATELET - Abnormal; Notable for the following components:   WBC 15.6 (*)    Neutro Abs 14.2 (*)    All other components within normal limits  URINALYSIS, ROUTINE W REFLEX MICROSCOPIC - Abnormal; Notable for the following components:   APPearance CLOUDY (*)    Protein, ur 30 (*)    Bacteria, UA RARE (*)    All other components  within normal limits  LIPASE, BLOOD  LACTIC ACID, PLASMA  MAGNESIUM  LACTIC ACID, PLASMA    EKG None  Radiology CT ABDOMEN PELVIS W CONTRAST  Result Date: 02/15/2023 CLINICAL DATA:  Acute abdominal pain for 1 day. EXAM: CT ABDOMEN AND PELVIS WITH CONTRAST TECHNIQUE: Multidetector CT imaging of the abdomen and pelvis was performed using the standard protocol following bolus administration of intravenous contrast. RADIATION DOSE REDUCTION: This exam was performed according to the departmental dose-optimization program which includes automated exposure control, adjustment of the mA and/or kV according to patient size and/or use of iterative reconstruction technique. CONTRAST:  75mL OMNIPAQUE IOHEXOL 300 MG/ML  SOLN COMPARISON:  CT stone study 02/18/2021 and older exams FINDINGS: Lower chest: There is some linear opacity lung bases likely scar or atelectasis. No pleural effusion. Nodule seen left lower lobe just above the diaphragm measuring 7 mm is stable going back to March 2022 demonstrating 2 years of stability. No additional imaging follow-up. Coronary artery calcifications are seen. Small hiatal hernia. Hepatobiliary: No focal liver abnormality is seen. No gallstones, gallbladder wall thickening, or biliary dilatation. Pancreas: Unremarkable. No pancreatic ductal dilatation or surrounding inflammatory changes. Spleen: Normal in size without focal abnormality. Adrenals/Urinary Tract: Moderate atrophy of the right kidney. No enhancing renal mass or collecting system dilatation. Left-sided parapelvic cysts. The ureters have normal course and caliber extending down to the bladder. The adrenal glands are preserved. Stomach/Bowel: Stomach is distended with fluid. The large bowel has some mild scattered colonic stool. Diffuse colonic diverticula. No bowel obstruction or dilatation. Normal appendix. There is some high attenuation debris in the lumen of the appendix. Small bowel has several fluid-filled loops  which are dilated up to 3.9 cm. Caliber change at the level of the midline anterior abdominal wall hernia. Decompressed loops more distally. Possible developing obstruction at the site of the hernia. Focal inflammatory stranding of the fat in this location as well with a small amount of fluid in the hernia sac. No pneumatosis or free air. Vascular/Lymphatic: Diffuse vascular calcifications. Normal caliber aorta and IVC. No specific abnormal lymph node enlargement identified in the abdomen and pelvis. Reproductive: Status post hysterectomy. No adnexal masses. Other: Mild mesenteric stranding. Musculoskeletal: Curvature of the spine with some degenerative changes. IMPRESSION: Midline anterior abdominal wall hernia with fluid, mesenteric fat as well as a loop of small bowel. There is associated dilatation of fluid-filled small bowel with a transition point at the hernia sac. Please correlate for focal developing obstruction and any evidence of incarceration. Separate colonic diverticula.  No free air or pneumatosis. Electronically Signed   By: Karen Kays M.D.   On: 02/15/2023 12:14  Procedures Procedures    Medications Ordered in ED Medications  lactated ringers bolus 500 mL (0 mLs Intravenous Stopped 02/15/23 1059)  HYDROmorphone (DILAUDID) injection 0.5 mg (0.5 mg Intravenous Given 02/15/23 1010)  ondansetron (ZOFRAN) injection 4 mg (4 mg Intravenous Given 02/15/23 1009)  iohexol (OMNIPAQUE) 300 MG/ML solution 75 mL (75 mLs Intravenous Contrast Given 02/15/23 1142)    ED Course/ Medical Decision Making/ A&P                             Medical Decision Making Amount and/or Complexity of Data Reviewed Labs: ordered. Radiology: ordered.  Risk Prescription drug management.   This patient presents to the ED for concern of abdominal pain, nausea, vomiting, this involves an extensive number of treatment options, and is a complaint that carries with it a high risk of complications and morbidity.   The differential diagnosis includes enteritis, gastritis, colitis, diverticulitis, SBO   Co morbidities that complicate the patient evaluation  HLD, HTN, nephrolithiasis, diverticulosis   Additional history obtained:  Additional history obtained from patient's daughter External records from outside source obtained and reviewed including EMR   Lab Tests:  I Ordered, and personally interpreted labs.  The pertinent results include: Baseline creatinine, normal electrolytes, normal hemoglobin.  A leukocytosis is present.  There is no evidence of UTI.  Lipase and hepatobiliary enzymes are normal.  Lactate is normal.   Imaging Studies ordered:  I ordered imaging studies including CT of the abdomen and pelvis I independently visualized and interpreted imaging which showed midline lower abdominal wall hernia with loop of bowel in it.  There is associated dilation of fluid-filled small bowel with transition point at the hernia, raising concern for obstruction I agree with the radiologist interpretation   Cardiac Monitoring: / EKG:  The patient was maintained on a cardiac monitor.  I personally viewed and interpreted the cardiac monitored which showed an underlying rhythm of: Sinus rhythm   Consultations Obtained:  I requested consultation with the general surgeon, Dr. Lovell Sheehan,  and discussed lab and imaging findings as well as pertinent plan - they recommend:, Given that hernia has likely self reduced, close follow-up on Thursday   Problem List / ED Course / Critical interventions / Medication management  Patient presents for abdominal pain, nausea, vomiting.  Onset was yesterday.  Symptoms worsened today.  On arrival, patient appears uncomfortable.  Her abdomen does seem distended and she does have generalized tenderness.  IV fluids were ordered for hydration.  Dilaudid and Zofran ordered for symptomatic relief.  Laboratory workup is notable for leukocytosis.  On CT scan of abdomen  pelvis, there is concern of a bowel obstruction caused by a lower ventral hernia.  On reassessment, patient reports that she has had resolution of symptoms.  At this point, her abdomen is no longer distended and is soft.  I am not able to appreciate expected mass at area of hernia, left of midline and below the umbilicus.  I spoke with general surgeon on-call, Dr. Lovell Sheehan, who feels that hernia likely reduced while she was in the ED.  He recommends outpatient follow-up if patient remains asymptomatic.  Patient was able to eat and drink in the ED without difficulty.  She continued to have resolution of her abdominal discomfort.  Patient was given information for follow-up with Dr. Lovell Sheehan in 2 days.  She was advised against heavy lifting.  She was advised to return to the ED if she does have  any recurrence of symptoms.  She was discharged in stable condition. I ordered medication including IV fluid for hydration; Dilaudid for analgesia; Zofran for nausea Reevaluation of the patient after these medicines showed that the patient resolved I have reviewed the patients home medicines and have made adjustments as needed   Social Determinants of Health:  Lives independently, has access to outpatient care         Final Clinical Impression(s) / ED Diagnoses Final diagnoses:  Generalized abdominal pain  Abdominal wall hernia    Rx / DC Orders ED Discharge Orders     None         Gloris Manchester, MD 02/15/23 1427

## 2023-02-15 NOTE — ED Triage Notes (Addendum)
Pt c/o abd/n/v/d since last night. Pt c/o sudden upper right and left abd pain and n/v x 4 since last night. One episode of diarrhea this am. Gen weakness noted. A/o. Color wnl. Family at bedside. Abd pain worse with movement

## 2023-02-17 ENCOUNTER — Encounter: Payer: Self-pay | Admitting: General Surgery

## 2023-02-17 ENCOUNTER — Ambulatory Visit: Payer: Medicare Other | Admitting: General Surgery

## 2023-02-17 VITALS — BP 108/71 | HR 71 | Temp 98.1°F | Resp 12 | Ht 68.0 in | Wt 193.0 lb

## 2023-02-17 DIAGNOSIS — K432 Incisional hernia without obstruction or gangrene: Secondary | ICD-10-CM

## 2023-02-17 NOTE — Progress Notes (Addendum)
Amanda Case; [id number]; 08-Feb-1946   HPI Patient is a 77 year old white female who was referred to my care by Dr. Durwin Nora of the emergency room for evaluation and treatment of a ventral hernia.  She originally was having nonspecific nausea and abdominal pain.  She was seen at an urgent care center and initially diagnosed with diverticulitis.  She did take her antibiotics but she had another episode of abdominal pain which caused her to to have significant nausea and vomiting.  She was seen in the emergency room and a CT scan of the abdomen and pelvis revealed a partial small bowel obstruction secondary to an incisional hernia.  It did reduce on its own and she went home.  She states that she still has intermittent lower abdominal pain but denies any nausea or vomiting.  She has had multiple lower abdominal surgeries including an abdominal hysterectomy and a ureteral reimplantation.  She does have a history of diverticulosis.  She denies any current fever or chills.   Past Medical History:  Diagnosis Date   History of kidney stones    Hypercholesterolemia    Hypertension    Kidney stone     Past Surgical History:  Procedure Laterality Date   ABDOMINAL HYSTERECTOMY     CYSTOSCOPY N/A 03/12/2021   Procedure: CYSTOSCOPY WITH FLUOROSCOPY;  Surgeon: Malen Gauze, MD;  Location: [facility];  Service: Urology;  Laterality: N/A;   CYSTOSCOPY WITH RETROGRADE PYELOGRAM, URETEROSCOPY AND STENT PLACEMENT Right 12/19/2020   Procedure: CYSTOSCOPY WITH RETROGRADE PYELOGRAM, URETEROSCOPY DIAGNOSTIC;  Surgeon: Malen Gauze, MD;  Location: [facility];  Service: Urology;  Laterality: Right;   IR NEPHROSTOMY PLACEMENT RIGHT  12/23/2020   IR NEPHROSTOMY TUBE CHANGE  02/17/2021   URETERAL REIMPLANTION Right 03/12/2021   Procedure: URETERAL REIMPLANT;  Surgeon: Malen Gauze, MD;  Location: [facility];  Service: Urology;  Laterality: Right;    History reviewed. No pertinent family history.  Current Outpatient  Medications on File Prior to Visit  Medication Sig Dispense Refill   aspirin EC 81 MG tablet Take 81 mg by mouth daily. Swallow whole.     cholecalciferol (VITAMIN D3) 25 MCG (1000 UNIT) tablet Take 1,000 Units by mouth daily.     hydrochlorothiazide (MICROZIDE) 12.5 MG capsule Take 12.5 mg by mouth daily.     losartan (COZAAR) 100 MG tablet Take 100 mg by mouth daily.     rosuvastatin (CRESTOR) 10 MG tablet Take 10 mg by mouth at bedtime.     vitamin B-12 (CYANOCOBALAMIN) 500 MCG tablet Take 500 mcg by mouth daily.     No current facility-administered medications on file prior to visit.    Allergies  Allergen Reactions   Cipro [Ciprofloxacin-Ciproflox Hcl Er] Hives    Social History   Substance and Sexual Activity  Alcohol Use No    Social History   Tobacco Use  Smoking Status Never  Smokeless Tobacco Never    Review of Systems  Constitutional: Negative.   HENT: Negative.    Eyes: Negative.   Respiratory:  Positive for shortness of breath.   Cardiovascular: Negative.   Gastrointestinal: Negative.   Genitourinary: Negative.   Musculoskeletal: Negative.   Skin: Negative.   Neurological: Negative.   Endo/Heme/Allergies: Negative.   Psychiatric/Behavioral: Negative.      Objective   Vitals:   02/17/23 0926  BP: 108/71  Pulse: 71  Resp: 12  Temp: 98.1 F (36.7 C)  SpO2: 97%    Physical Exam Vitals reviewed.  Constitutional:  Appearance: Normal appearance. She is not ill-appearing.  HENT:     Head: Normocephalic and atraumatic.  Cardiovascular:     Rate and Rhythm: Normal rate and regular rhythm.     Heart sounds: Normal heart sounds. No murmur heard.    No friction rub. No gallop.  Pulmonary:     Effort: Pulmonary effort is normal. No respiratory distress.     Breath sounds: Normal breath sounds. No stridor. No wheezing, rhonchi or rales.  Abdominal:     General: Bowel sounds are normal. There is no distension.     Palpations: Abdomen is soft.  There is no mass.     Tenderness: There is no abdominal tenderness. There is no guarding or rebound.     Hernia: A hernia is present.     Comments: A lower midline surgical scar is present with a reducible greater than 3 cm hernia defect noted midway between the umbilicus and the suprapubic region.  Skin:    General: Skin is warm and dry.  Neurological:     Mental Status: She is alert and oriented to person, place, and time.   CT scan images personally reviewed ER notes reviewed  Assessment  Incisional hernia Plan  Patient is scheduled for robotic assisted incisional herniorrhaphy with mesh on 02/28/2023.  The risks and benefits of the procedure including bleeding, infection, recurrence, bowel injury, and the possibility of needing an open procedure were fully explained to the patient, who gave informed consent.

## 2023-02-18 ENCOUNTER — Telehealth: Payer: Self-pay

## 2023-02-18 NOTE — Telephone Encounter (Signed)
We should probably wait a good 4 weeks after her surgery. We should see her in the office prior to scheduling to ensure she is doing ok after surgery.

## 2023-02-18 NOTE — Telephone Encounter (Signed)
Patient in scheduled for ventral hernia surgery 6/10. How long after will she need to wait to be scheduled for colonoscopy?

## 2023-02-18 NOTE — H&P (Signed)
Amanda Case; [id number]; 07/04/1946   HPI Patient is a 76-year-old white female who was referred to my care by Dr. Dixon of the emergency room for evaluation and treatment of a ventral hernia.  She originally was having nonspecific nausea and abdominal pain.  She was seen at an urgent care center and initially diagnosed with diverticulitis.  She did take her antibiotics but she had another episode of abdominal pain which caused her to to have significant nausea and vomiting.  She was seen in the emergency room and a CT scan of the abdomen and pelvis revealed a partial small bowel obstruction secondary to an incisional hernia.  It did reduce on its own and she went home.  She states that she still has intermittent lower abdominal pain but denies any nausea or vomiting.  She has had multiple lower abdominal surgeries including an abdominal hysterectomy and a ureteral reimplantation.  She does have a history of diverticulosis.  She denies any current fever or chills.   Past Medical History:  Diagnosis Date   History of kidney stones    Hypercholesterolemia    Hypertension    Kidney stone     Past Surgical History:  Procedure Laterality Date   ABDOMINAL HYSTERECTOMY     CYSTOSCOPY N/A 03/12/2021   Procedure: CYSTOSCOPY WITH FLUOROSCOPY;  Surgeon: McKenzie, Patrick L, MD;  Location: [facility];  Service: Urology;  Laterality: N/A;   CYSTOSCOPY WITH RETROGRADE PYELOGRAM, URETEROSCOPY AND STENT PLACEMENT Right 12/19/2020   Procedure: CYSTOSCOPY WITH RETROGRADE PYELOGRAM, URETEROSCOPY DIAGNOSTIC;  Surgeon: McKenzie, Patrick L, MD;  Location: [facility];  Service: Urology;  Laterality: Right;   IR NEPHROSTOMY PLACEMENT RIGHT  12/23/2020   IR NEPHROSTOMY TUBE CHANGE  02/17/2021   URETERAL REIMPLANTION Right 03/12/2021   Procedure: URETERAL REIMPLANT;  Surgeon: McKenzie, Patrick L, MD;  Location: [facility];  Service: Urology;  Laterality: Right;    History reviewed. No pertinent family history.  Current Outpatient  Medications on File Prior to Visit  Medication Sig Dispense Refill   aspirin EC 81 MG tablet Take 81 mg by mouth daily. Swallow whole.     cholecalciferol (VITAMIN D3) 25 MCG (1000 UNIT) tablet Take 1,000 Units by mouth daily.     hydrochlorothiazide (MICROZIDE) 12.5 MG capsule Take 12.5 mg by mouth daily.     losartan (COZAAR) 100 MG tablet Take 100 mg by mouth daily.     rosuvastatin (CRESTOR) 10 MG tablet Take 10 mg by mouth at bedtime.     vitamin B-12 (CYANOCOBALAMIN) 500 MCG tablet Take 500 mcg by mouth daily.     No current facility-administered medications on file prior to visit.    Allergies  Allergen Reactions   Cipro [Ciprofloxacin-Ciproflox Hcl Er] Hives    Social History   Substance and Sexual Activity  Alcohol Use No    Social History   Tobacco Use  Smoking Status Never  Smokeless Tobacco Never    Review of Systems  Constitutional: Negative.   HENT: Negative.    Eyes: Negative.   Respiratory:  Positive for shortness of breath.   Cardiovascular: Negative.   Gastrointestinal: Negative.   Genitourinary: Negative.   Musculoskeletal: Negative.   Skin: Negative.   Neurological: Negative.   Endo/Heme/Allergies: Negative.   Psychiatric/Behavioral: Negative.      Objective   Vitals:   02/17/23 0926  BP: 108/71  Pulse: 71  Resp: 12  Temp: 98.1 F (36.7 C)  SpO2: 97%    Physical Exam Vitals reviewed.  Constitutional:        Appearance: Normal appearance. She is not ill-appearing.  HENT:     Head: Normocephalic and atraumatic.  Cardiovascular:     Rate and Rhythm: Normal rate and regular rhythm.     Heart sounds: Normal heart sounds. No murmur heard.    No friction rub. No gallop.  Pulmonary:     Effort: Pulmonary effort is normal. No respiratory distress.     Breath sounds: Normal breath sounds. No stridor. No wheezing, rhonchi or rales.  Abdominal:     General: Bowel sounds are normal. There is no distension.     Palpations: Abdomen is soft.  There is no mass.     Tenderness: There is no abdominal tenderness. There is no guarding or rebound.     Hernia: A hernia is present.     Comments: A lower midline surgical scar is present with a reducible greater than 3 cm hernia defect noted midway between the umbilicus and the suprapubic region.  Skin:    General: Skin is warm and dry.  Neurological:     Mental Status: She is alert and oriented to person, place, and time.   CT scan images personally reviewed ER notes reviewed  Assessment  Incisional hernia Plan  Patient is scheduled for robotic assisted incisional herniorrhaphy with mesh on 02/28/2023.  The risks and benefits of the procedure including bleeding, infection, recurrence, bowel injury, and the possibility of needing an open procedure were fully explained to the patient, who gave informed consent. 

## 2023-02-18 NOTE — Telephone Encounter (Signed)
Patient called in about concern of lab work. Patient states she wanted Dr. Ronne Binning to review lab work after her PCP states her kidneys were in bad shape. Dr. 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 Patient voiced understanding

## 2023-02-18 NOTE — Telephone Encounter (Signed)
NotedMarchelle Folks, please schedule appt per Kristen 4+ weeks after patient surgery 6/10. Thanks!

## 2023-02-22 ENCOUNTER — Telehealth: Payer: Self-pay | Admitting: *Deleted

## 2023-02-22 ENCOUNTER — Telehealth: Payer: Self-pay | Admitting: Gastroenterology

## 2023-02-22 NOTE — Telephone Encounter (Signed)
Transition Care Management Unsuccessful Follow-up Telephone Call  Date of discharge and from where:  [facility]  Attempts:  2nd Attempt  Reason for unsuccessful TCM follow-up call:  Left voice message

## 2023-02-22 NOTE — Telephone Encounter (Signed)
Transition Care Management Unsuccessful Follow-up Telephone Call  Date of discharge and from where:  [facility]  Attempts:  1st Attempt  Reason for unsuccessful TCM follow-up call:  Left voice message

## 2023-02-22 NOTE — Telephone Encounter (Signed)
I have not been able to review patient's CT disc she brought to her last OV. However, she has since has a CT of her abdomen and pelvis with contrast on 02/15/23 at [facility] ER which showed midline anterior abdominal wall hernia with fluid, mesenteric fat as well as a loop of small bowel. There is associated dilatation of fluid-filled small bowel with a transition point at the hernia sac. She was having symptoms of bowel obstruction when she presented, but her symptoms resolved in the ED, and she was able to tolerate oral intake. She had follow-up with Dr. Lovell Sheehan on 5/30 and is scheduled for hernia repair on 02/28/23.   I am sure this is the "small blockage" she was referring to on her prior CT imaging.   No further recommendations at this time. I have recommended follow-up in our office 4 weeks after her surgery to re-group and schedule her colonoscopy.

## 2023-02-25 ENCOUNTER — Encounter (HOSPITAL_COMMUNITY)
Admission: RE | Admit: 2023-02-25 | Discharge: 2023-02-25 | Disposition: A | Discharge status: Home / Self Care | Payer: Medicare Other | Source: Ambulatory Visit | Attending: General Surgery | Admitting: General Surgery

## 2023-02-25 ENCOUNTER — Encounter (HOSPITAL_COMMUNITY): Payer: Self-pay

## 2023-02-25 ENCOUNTER — Other Ambulatory Visit: Payer: Self-pay

## 2023-02-28 ENCOUNTER — Ambulatory Visit (HOSPITAL_COMMUNITY)
Admission: RE | Admit: 2023-02-28 | Discharge: 2023-02-28 | Disposition: A | Discharge status: Home / Self Care | Payer: Medicare Other | Attending: General Surgery | Admitting: General Surgery

## 2023-02-28 ENCOUNTER — Ambulatory Visit (HOSPITAL_COMMUNITY): Payer: Medicare Other | Admitting: Anesthesiology

## 2023-02-28 ENCOUNTER — Ambulatory Visit (HOSPITAL_BASED_OUTPATIENT_CLINIC_OR_DEPARTMENT_OTHER): Payer: Medicare Other | Admitting: Anesthesiology

## 2023-02-28 ENCOUNTER — Encounter (HOSPITAL_COMMUNITY): Payer: Self-pay | Admitting: General Surgery

## 2023-02-28 ENCOUNTER — Other Ambulatory Visit: Payer: Self-pay

## 2023-02-28 ENCOUNTER — Encounter (HOSPITAL_COMMUNITY)
Admission: RE | Disposition: A | Discharge status: Home / Self Care | Payer: Self-pay | Source: Home / Self Care | Attending: General Surgery

## 2023-02-28 DIAGNOSIS — I10 Essential (primary) hypertension: Secondary | ICD-10-CM | POA: Diagnosis not present

## 2023-02-28 DIAGNOSIS — K429 Umbilical hernia without obstruction or gangrene: Secondary | ICD-10-CM | POA: Diagnosis not present

## 2023-02-28 DIAGNOSIS — K432 Incisional hernia without obstruction or gangrene: Secondary | ICD-10-CM

## 2023-02-28 DIAGNOSIS — N200 Calculus of kidney: Secondary | ICD-10-CM

## 2023-02-28 DIAGNOSIS — K439 Ventral hernia without obstruction or gangrene: Secondary | ICD-10-CM | POA: Diagnosis not present

## 2023-02-28 HISTORY — PX: XI ROBOTIC ASSISTED VENTRAL HERNIA: SHX6789

## 2023-02-28 SURGERY — REPAIR, HERNIA, VENTRAL, ROBOT-ASSISTED
Anesthesia: General | Site: Abdomen

## 2023-02-28 MED ORDER — LACTATED RINGERS IV SOLN: INTRAVENOUS | Status: DC

## 2023-02-28 MED ORDER — KETOROLAC TROMETHAMINE 30 MG/ML IJ SOLN
INTRAMUSCULAR | Status: DC | PRN
  Administered 2023-02-28: 30 mg via INTRAVENOUS

## 2023-02-28 MED ORDER — PHENYLEPHRINE HCL (PRESSORS) 10 MG/ML IV SOLN
INTRAVENOUS | Status: DC | PRN
  Administered 2023-02-28: 160 ug via INTRAVENOUS
  Administered 2023-02-28 (×2): 100 ug via INTRAVENOUS

## 2023-02-28 MED ORDER — CHLORHEXIDINE GLUCONATE 0.12 % MT SOLN
15.0000 mL | Freq: Once | OROMUCOSAL | Status: AC
  Administered 2023-02-28: 15 mL via OROMUCOSAL

## 2023-02-28 MED ORDER — FENTANYL CITRATE (PF) 100 MCG/2ML IJ SOLN
INTRAMUSCULAR | Status: DC | PRN
  Administered 2023-02-28 (×2): 50 ug via INTRAVENOUS

## 2023-02-28 MED ORDER — SUGAMMADEX SODIUM 200 MG/2ML IV SOLN
INTRAVENOUS | Status: DC | PRN
  Administered 2023-02-28: 200 mg via INTRAVENOUS

## 2023-02-28 MED ORDER — ONDANSETRON HCL 4 MG/2ML IJ SOLN
INTRAMUSCULAR | Status: DC | PRN
  Administered 2023-02-28: 4 mg via INTRAVENOUS

## 2023-02-28 MED ORDER — TRAMADOL HCL 50 MG PO TABS: 50.0000 mg | ORAL_TABLET | Freq: Four times a day (QID) | ORAL | 0 refills | Status: DC | PRN

## 2023-02-28 MED ORDER — PHENYLEPHRINE 80 MCG/ML (10ML) SYRINGE FOR IV PUSH (FOR BLOOD PRESSURE SUPPORT)
PREFILLED_SYRINGE | INTRAVENOUS | Status: AC
  Filled 2023-02-28: qty 10

## 2023-02-28 MED ORDER — BUPIVACAINE HCL (PF) 0.5 % IJ SOLN
INTRAMUSCULAR | Status: DC | PRN
  Administered 2023-02-28: 30 mL

## 2023-02-28 MED ORDER — DEXAMETHASONE SODIUM PHOSPHATE 10 MG/ML IJ SOLN
INTRAMUSCULAR | Status: AC
  Filled 2023-02-28: qty 1

## 2023-02-28 MED ORDER — BUPIVACAINE HCL (PF) 0.5 % IJ SOLN
INTRAMUSCULAR | Status: AC
  Filled 2023-02-28: qty 30

## 2023-02-28 MED ORDER — LIDOCAINE HCL (PF) 2 % IJ SOLN
INTRAMUSCULAR | Status: AC
  Filled 2023-02-28: qty 5

## 2023-02-28 MED ORDER — CEFAZOLIN SODIUM-DEXTROSE 2-4 GM/100ML-% IV SOLN
INTRAVENOUS | Status: AC
  Filled 2023-02-28: qty 100

## 2023-02-28 MED ORDER — HYDROMORPHONE HCL 1 MG/ML IJ SOLN: 0.2500 mg | INTRAMUSCULAR | Status: DC | PRN

## 2023-02-28 MED ORDER — CHLORHEXIDINE GLUCONATE CLOTH 2 % EX PADS: 6.0000 | MEDICATED_PAD | Freq: Once | CUTANEOUS | Status: DC

## 2023-02-28 MED ORDER — ROCURONIUM 10MG/ML (10ML) SYRINGE FOR MEDFUSION PUMP - OPTIME
INTRAVENOUS | Status: DC | PRN
  Administered 2023-02-28: 60 mg via INTRAVENOUS
  Administered 2023-02-28: 20 mg via INTRAVENOUS

## 2023-02-28 MED ORDER — LIDOCAINE HCL (CARDIAC) PF 50 MG/5ML IV SOSY
PREFILLED_SYRINGE | INTRAVENOUS | Status: DC | PRN
  Administered 2023-02-28: 80 mg via INTRAVENOUS

## 2023-02-28 MED ORDER — ONDANSETRON HCL 4 MG/2ML IJ SOLN: 4.0000 mg | Freq: Once | INTRAMUSCULAR | Status: DC | PRN

## 2023-02-28 MED ORDER — ONDANSETRON HCL 4 MG/2ML IJ SOLN
INTRAMUSCULAR | Status: AC
  Filled 2023-02-28: qty 2

## 2023-02-28 MED ORDER — ORAL CARE MOUTH RINSE: 15.0000 mL | Freq: Once | OROMUCOSAL | Status: AC

## 2023-02-28 MED ORDER — KETOROLAC TROMETHAMINE 30 MG/ML IJ SOLN
INTRAMUSCULAR | Status: AC
  Filled 2023-02-28: qty 1

## 2023-02-28 MED ORDER — PROPOFOL 10 MG/ML IV BOLUS
INTRAVENOUS | Status: DC | PRN
  Administered 2023-02-28: 160 mg via INTRAVENOUS

## 2023-02-28 MED ORDER — FENTANYL CITRATE (PF) 100 MCG/2ML IJ SOLN
INTRAMUSCULAR | Status: AC
  Filled 2023-02-28: qty 2

## 2023-02-28 MED ORDER — CEFAZOLIN SODIUM-DEXTROSE 2-4 GM/100ML-% IV SOLN
2.0000 g | INTRAVENOUS | Status: AC
  Administered 2023-02-28: 2 g via INTRAVENOUS

## 2023-02-28 MED ORDER — STERILE WATER FOR IRRIGATION IR SOLN
Status: DC | PRN
  Administered 2023-02-28: 500 mL

## 2023-02-28 MED ORDER — DEXAMETHASONE SODIUM PHOSPHATE 10 MG/ML IJ SOLN
INTRAMUSCULAR | Status: DC | PRN
  Administered 2023-02-28: 10 mg via INTRAVENOUS

## 2023-02-28 SURGICAL SUPPLY — 41 items
ADH SKN CLS APL DERMABOND .7 (GAUZE/BANDAGES/DRESSINGS) ×1
APL PRP STRL LF DISP 70% ISPRP (MISCELLANEOUS) ×1
CHLORAPREP W/TINT 26 (MISCELLANEOUS) ×2 IMPLANT
COVER LIGHT HANDLE (MISCELLANEOUS) IMPLANT
COVER MAYO STAND XLG (MISCELLANEOUS) ×2 IMPLANT
COVER TIP SHEARS 8 DVNC (MISCELLANEOUS) ×2 IMPLANT
DERMABOND ADVANCED .7 DNX12 (GAUZE/BANDAGES/DRESSINGS) ×2 IMPLANT
DRAPE ARM DVNC X/XI (DISPOSABLE) ×6 IMPLANT
DRAPE COLUMN DVNC XI (DISPOSABLE) ×2 IMPLANT
DRIVER NDL MEGA SUTCUT DVNCXI (INSTRUMENTS) ×2 IMPLANT
DRIVER NDLE MEGA SUTCUT DVNCXI (INSTRUMENTS) ×1 IMPLANT
ELECT REM PT RETURN 9FT ADLT (ELECTROSURGICAL) ×1
ELECTRODE REM PT RTRN 9FT ADLT (ELECTROSURGICAL) ×2 IMPLANT
FORCEPS BPLR R/ABLATION 8 DVNC (INSTRUMENTS) ×2 IMPLANT
GLOVE BIOGEL PI IND STRL 6.5 (GLOVE) IMPLANT
GLOVE BIOGEL PI IND STRL 7.0 (GLOVE) ×8 IMPLANT
GLOVE SS BIOGEL STRL SZ 6.5 (GLOVE) IMPLANT
GLOVE SURG SS PI 7.5 STRL IVOR (GLOVE) ×4 IMPLANT
GOWN STRL REUS W/TWL LRG LVL3 (GOWN DISPOSABLE) ×6 IMPLANT
KIT TURNOVER KIT A (KITS) ×2 IMPLANT
MESH VENTRALIGHT ST 4.5IN (Mesh General) IMPLANT
NDL HYPO 21X1.5 SAFETY (NEEDLE) ×2 IMPLANT
NDL INSUFFLATION 14GA 120MM (NEEDLE) ×2 IMPLANT
NEEDLE HYPO 21X1.5 SAFETY (NEEDLE) ×1 IMPLANT
NEEDLE INSUFFLATION 14GA 120MM (NEEDLE) ×1 IMPLANT
OBTURATOR OPTICAL STND 8 DVNC (TROCAR) ×1
OBTURATOR OPTICALSTD 8 DVNC (TROCAR) ×2 IMPLANT
PACK LAP CHOLE LZT030E (CUSTOM PROCEDURE TRAY) ×2 IMPLANT
PAD ARMBOARD 7.5X6 YLW CONV (MISCELLANEOUS) ×2 IMPLANT
SCISSORS MNPLR CVD DVNC XI (INSTRUMENTS) ×2 IMPLANT
SEAL CANN UNIV 5-8 DVNC XI (MISCELLANEOUS) ×6 IMPLANT
SET BASIN LINEN APH (SET/KITS/TRAYS/PACK) ×2 IMPLANT
SET TUBE SMOKE EVAC HIGH FLOW (TUBING) ×2 IMPLANT
SUT MNCRL AB 4-0 PS2 18 (SUTURE) ×4 IMPLANT
SUT STRATAFIX PDS 30 CT-1 (SUTURE) ×2 IMPLANT
SUT V-LOC 90 ABS 3-0 VLT V-20 (SUTURE) ×2 IMPLANT
SYR 30ML LL (SYRINGE) ×2 IMPLANT
TAPE TRANSPORE STRL 2 31045 (GAUZE/BANDAGES/DRESSINGS) ×4 IMPLANT
TRAY FOLEY W/BAG SLVR 16FR (SET/KITS/TRAYS/PACK) ×1
TRAY FOLEY W/BAG SLVR 16FR ST (SET/KITS/TRAYS/PACK) ×2 IMPLANT
WATER STERILE IRR 500ML POUR (IV SOLUTION) ×2 IMPLANT

## 2023-02-28 NOTE — Interval H&P Note (Signed)
History and Physical Interval Note:  02/28/2023 11:07 AM  Amanda Case  has presented today for surgery, with the diagnosis of Incisional/ventral hernia.  The various methods of treatment have been discussed with the patient and family. After consideration of risks, benefits and other options for treatment, the patient has consented to  Procedure(s): XI ROBOTIC ASSISTED VENTRAL HERNIA (N/A) as a surgical intervention.  The patient's history has been reviewed, patient examined, no change in status, stable for surgery.  I have reviewed the patient's chart and labs.  Questions were answered to the patient's satisfaction.     Amanda Case

## 2023-02-28 NOTE — Anesthesia Postprocedure Evaluation (Signed)
Anesthesia Post Note  Patient: Amanda Case  Procedure(s) Performed: XI ROBOTIC ASSISTED VENTRAL HERNIA (Abdomen)  Patient location during evaluation: Phase II Anesthesia Type: General Level of consciousness: awake and alert and oriented Pain management: pain level controlled Vital Signs Assessment: post-procedure vital signs reviewed and stable Respiratory status: spontaneous breathing, nonlabored ventilation and respiratory function stable Cardiovascular status: blood pressure returned to baseline and stable Postop Assessment: no apparent nausea or vomiting Anesthetic complications: no  No notable events documented.   Last Vitals:  Vitals:   02/28/23 1600 02/28/23 1602  BP: (!) 141/67 (!) 141/67  Pulse: 80 83  Resp: 15 14  Temp:  36.4 C  SpO2: 97% 98%    Last Pain:  Vitals:   02/28/23 1602  TempSrc: Oral  PainSc: 0-No pain                 Raychell Holcomb C Breckyn Ticas

## 2023-02-28 NOTE — Anesthesia Procedure Notes (Addendum)
Procedure Name: Intubation Date/Time: 02/28/2023 12:57 PM  Performed by: Moshe Salisbury, CRNAPre-anesthesia Checklist: Patient identified, Patient being monitored, Timeout performed, Emergency Drugs available and Suction available Patient Re-evaluated:Patient Re-evaluated prior to induction Oxygen Delivery Method: Circle system utilized Preoxygenation: Pre-oxygenation with 100% oxygen Induction Type: IV induction Ventilation: Mask ventilation without difficulty Laryngoscope Size: Mac and 3 Grade View: Grade I Tube type: Oral Tube size: 7.0 mm Number of attempts: 1 Airway Equipment and Method: Stylet Placement Confirmation: ETT inserted through vocal cords under direct vision, positive ETCO2 and breath sounds checked- equal and bilateral Secured at: 21 cm Tube secured with: Tape Dental Injury: Teeth and Oropharynx as per pre-operative assessment

## 2023-02-28 NOTE — Transfer of Care (Signed)
Immediate Anesthesia Transfer of Care Note  Patient: Amanda Case  Procedure(s) Performed: XI ROBOTIC ASSISTED VENTRAL HERNIA (Abdomen)  Patient Location: PACU  Anesthesia Type:General  Level of Consciousness: awake  Airway & Oxygen Therapy: Patient Spontanous Breathing and Patient connected to face mask oxygen  Post-op Assessment: Report given to RN and Post -op Vital signs reviewed and stable  Post vital signs: Reviewed and stable  Last Vitals:  Vitals Value Taken Time  BP    Temp    Pulse    Resp    SpO2      Last Pain:  Vitals:   02/28/23 1056  PainSc: 0-No pain         Complications: No notable events documented.

## 2023-02-28 NOTE — Op Note (Signed)
Patient:  Amanda Case  DOB:  1946-08-20  MRN:  [id number]   Preop Diagnosis: Incisional hernia  Postop Diagnosis: Same, supraumbilical hernia  Procedure: Robotic assisted laparoscopic incisional herniorrhaphy with mesh, umbilical herniorrhaphy  Surgeon: Franky Macho, MD  Anes: General endotracheal  Indications: Patient is a 77 year old white female who was seen in the emergency room from a small bowel obstruction secondary to incarcerated hernia which resolved on its own.  She now presents for an incisional herniorrhaphy with mesh.  She also has a smaller supraumbilical fat filled hernia.  The risks and benefits of the procedure including bleeding, infection, mesh use, the possibility of an open procedure were fully explained to the patient, who gave informed consent.  Procedure note: The patient was placed in supine position.  After induction of general endotracheal anesthesia, the abdomen was prepped and draped using usual sterile technique with ChloraPrep.  Surgical site confirmation was performed.  An incision was made in the left upper quadrant at Palmer's point.  A Veress needle was introduced into the abdominal cavity and confirmation of placement was done using the saline drop test.  The abdomen was then insufflated to 15 mmHg pressure.  An 8 mm trocar was introduced into the abdominal cavity under direct visualization without difficulty.  An additional 8 mm trocar was placed in the epigastric region and left flank region.  The patient had both omentum and portions of the small bowel adhesed to the lower midline incision from the umbilicus to the suprapubic region.  Using sharp scissor dissection, the omentum and bowel was freed away from the anterior abdominal wall.  The patient ultimately had a 3-1/2 to 4 cm hernia defect approximately 5 to 6 cm above the pubic symphysis.  It was elected to proceed with mesh closure of this hernia defect.  The abdomen was decompressed to 5 mmHg  pressure.  The defect was closed longitudinally using an 0 Stratafix running suture.  An 11 cm Bard Ventralight dual mesh was then placed and secured circumferentially to the anterior abdominal wall using 3-0 V-Loc running sutures.  Right at the level of the umbilicus, a smaller 1-1/2 cm defect was found.  This was closed primarily using an 0 Stratafix running suture.  The bowel was inspected and there was no serosal tear or bowel injury.  The robot was undocked and all air was evacuated from the abdominal cavity prior to removal of the trocars.  All wounds were irrigated with normal saline.  All wounds were injected with point 5% Sensorcaine.  All incisions were closed using a 4-0 Monocryl subcuticular suture.  Dermabond was applied.  All tape and needle counts were correct at the end of the procedure.  The patient was extubated in the operating room and transferred to PACU in stable condition.  Complications: None  EBL: Minimal  Specimen: None

## 2023-02-28 NOTE — Anesthesia Preprocedure Evaluation (Signed)
Anesthesia Evaluation  Patient identified by MRN, date of birth, ID band Patient awake    Reviewed: Allergy & Precautions, H&P , NPO status , Patient's Chart, lab work & pertinent test results  Airway Mallampati: II  TM Distance: >3 FB Neck ROM: Full    Dental  (+) Missing, Dental Advisory Given   Pulmonary neg pulmonary ROS   Pulmonary exam normal breath sounds clear to auscultation       Cardiovascular Exercise Tolerance: Good hypertension, Pt. on medications Normal cardiovascular exam Rhythm:Regular Rate:Normal     Neuro/Psych negative neurological ROS  negative psych ROS   GI/Hepatic negative GI ROS, Neg liver ROS,,,  Endo/Other  negative endocrine ROS    Renal/GU Renal disease (Stones)  negative genitourinary   Musculoskeletal negative musculoskeletal ROS (+)    Abdominal   Peds negative pediatric ROS (+)  Hematology negative hematology ROS (+)   Anesthesia Other Findings   Reproductive/Obstetrics negative OB ROS                             Anesthesia Physical Anesthesia Plan  ASA: 2  Anesthesia Plan: General   Post-op Pain Management: Dilaudid IV   Induction: Intravenous  PONV Risk Score and Plan: 4 or greater and Ondansetron, Dexamethasone and Midazolam  Airway Management Planned: Oral ETT  Additional Equipment:   Intra-op Plan:   Post-operative Plan: Extubation in OR  Informed Consent: I have reviewed the patients History and Physical, chart, labs and discussed the procedure including the risks, benefits and alternatives for the proposed anesthesia with the patient or authorized representative who has indicated his/her understanding and acceptance.     Dental advisory given  Plan Discussed with: CRNA and Surgeon  Anesthesia Plan Comments:        Anesthesia Quick Evaluation

## 2023-03-02 ENCOUNTER — Encounter (HOSPITAL_COMMUNITY): Payer: Self-pay | Admitting: General Surgery

## 2023-03-08 ENCOUNTER — Encounter: Payer: Self-pay | Admitting: General Surgery

## 2023-03-08 ENCOUNTER — Ambulatory Visit: Payer: Medicare Other | Admitting: General Surgery

## 2023-03-08 VITALS — BP 133/75 | HR 77 | Temp 98.7°F | Resp 12 | Ht 68.0 in | Wt 192.0 lb

## 2023-03-08 DIAGNOSIS — Z09 Encounter for follow-up examination after completed treatment for conditions other than malignant neoplasm: Secondary | ICD-10-CM

## 2023-03-08 NOTE — Progress Notes (Signed)
Subjective:     Amanda Case  Patient here for postoperative visit, status post robotic assisted laparoscopic incisional herniorrhaphy with mesh.  Patient is doing very well.  She denies any nausea or vomiting.  She has no incisional pain. Objective:    BP 133/75   Pulse 77   Temp 98.7 F (37.1 C) (Oral)   Resp 12   Ht 5\' 8"  (1.727 m)   Wt 192 lb (87.1 kg)   SpO2 96%   BMI 29.19 kg/m   General:  alert, cooperative, and no distress  Abdomen soft, incisions healing well.     Assessment:    Doing well postoperatively.    Plan:   May increase activity as able.  Follow-up here as needed.

## 2023-03-10 DIAGNOSIS — Z299 Encounter for prophylactic measures, unspecified: Secondary | ICD-10-CM | POA: Diagnosis not present

## 2023-03-10 DIAGNOSIS — Z0000 Encounter for general adult medical examination without abnormal findings: Secondary | ICD-10-CM | POA: Diagnosis not present

## 2023-03-10 DIAGNOSIS — I10 Essential (primary) hypertension: Secondary | ICD-10-CM | POA: Diagnosis not present

## 2023-03-10 DIAGNOSIS — I700 Atherosclerosis of aorta: Secondary | ICD-10-CM | POA: Diagnosis not present

## 2023-03-10 DIAGNOSIS — N183 Chronic kidney disease, stage 3 unspecified: Secondary | ICD-10-CM | POA: Diagnosis not present

## 2023-03-10 DIAGNOSIS — Z7189 Other specified counseling: Secondary | ICD-10-CM | POA: Diagnosis not present

## 2023-03-30 ENCOUNTER — Ambulatory Visit: Payer: Medicare Other | Admitting: Gastroenterology

## 2023-04-06 DIAGNOSIS — R5383 Other fatigue: Secondary | ICD-10-CM | POA: Diagnosis not present

## 2023-04-06 DIAGNOSIS — Z299 Encounter for prophylactic measures, unspecified: Secondary | ICD-10-CM | POA: Diagnosis not present

## 2023-04-06 DIAGNOSIS — I10 Essential (primary) hypertension: Secondary | ICD-10-CM | POA: Diagnosis not present

## 2023-04-06 DIAGNOSIS — Z79899 Other long term (current) drug therapy: Secondary | ICD-10-CM | POA: Diagnosis not present

## 2023-04-06 DIAGNOSIS — E7800 Pure hypercholesterolemia, unspecified: Secondary | ICD-10-CM | POA: Diagnosis not present

## 2023-04-06 DIAGNOSIS — M8580 Other specified disorders of bone density and structure, unspecified site: Secondary | ICD-10-CM | POA: Diagnosis not present

## 2023-04-06 DIAGNOSIS — E559 Vitamin D deficiency, unspecified: Secondary | ICD-10-CM | POA: Diagnosis not present

## 2023-04-06 DIAGNOSIS — Z0000 Encounter for general adult medical examination without abnormal findings: Secondary | ICD-10-CM | POA: Diagnosis not present

## 2023-04-06 DIAGNOSIS — R739 Hyperglycemia, unspecified: Secondary | ICD-10-CM | POA: Diagnosis not present

## 2023-04-07 LAB — TSH: TSH: 0.09 — AB (ref 0.41–5.90)

## 2023-05-18 DIAGNOSIS — M1712 Unilateral primary osteoarthritis, left knee: Secondary | ICD-10-CM | POA: Diagnosis not present

## 2023-06-01 DIAGNOSIS — Z23 Encounter for immunization: Secondary | ICD-10-CM | POA: Diagnosis not present

## 2023-08-04 ENCOUNTER — Ambulatory Visit (HOSPITAL_COMMUNITY)
Admission: RE | Admit: 2023-08-04 | Discharge: 2023-08-04 | Disposition: A | Discharge status: Home / Self Care | Payer: Medicare Other | Source: Ambulatory Visit | Attending: Urology | Admitting: Urology

## 2023-08-04 ENCOUNTER — Ambulatory Visit: Payer: Medicare Other | Admitting: Nurse Practitioner

## 2023-08-04 DIAGNOSIS — E0590 Thyrotoxicosis, unspecified without thyrotoxic crisis or storm: Secondary | ICD-10-CM | POA: Diagnosis not present

## 2023-08-04 DIAGNOSIS — N1330 Unspecified hydronephrosis: Secondary | ICD-10-CM | POA: Diagnosis not present

## 2023-08-04 DIAGNOSIS — I10 Essential (primary) hypertension: Secondary | ICD-10-CM | POA: Diagnosis not present

## 2023-08-04 DIAGNOSIS — N131 Hydronephrosis with ureteral stricture, not elsewhere classified: Secondary | ICD-10-CM | POA: Diagnosis not present

## 2023-08-04 DIAGNOSIS — Z299 Encounter for prophylactic measures, unspecified: Secondary | ICD-10-CM | POA: Diagnosis not present

## 2023-08-04 DIAGNOSIS — N281 Cyst of kidney, acquired: Secondary | ICD-10-CM | POA: Diagnosis not present

## 2023-08-04 DIAGNOSIS — R002 Palpitations: Secondary | ICD-10-CM | POA: Diagnosis not present

## 2023-08-04 NOTE — Patient Instructions (Signed)
Hyperthyroidism Hyperthyroidism refers to a thyroid gland that is too active or overactive. The thyroid gland is a small gland located in the lower front part of the neck, just in front of the windpipe (trachea). This gland makes hormones that: Help control how the body uses food for energy (metabolism). Help the heart and brain work well. Keep your bones strong. When the thyroid is overactive, it produces too much of a hormone called thyroxine. What are the causes? This condition may be caused by: Graves' disease. This is a disorder in which the body's disease-fighting system (immune system) attacks the thyroid gland. This is the most common cause. Inflammation of the thyroid gland. A tumor in the thyroid gland. Use of certain medicines, including: Prescription thyroid hormone replacement. Herbal supplements that mimic thyroid hormones. Amiodarone therapy. Solid or fluid-filled lumps within your thyroid gland (thyroid nodules). Taking in a large amount of iodine from foods or medicines. What increases the risk? You are more likely to develop this condition if: You are female. You have a family history of thyroid conditions. You smoke tobacco. You use a medicine called lithium. You take medicines that affect the immune system (immunosuppressants). What are the signs or symptoms? Symptoms of this condition include: Nervousness. Inability to tolerate heat. Diarrhea. Rapid heart rate. Shaky hands. Restlessness. Sleep problems. Other symptoms may include: Heart skipping beats or making extra beats. Unexplained weight loss. Change in the texture of hair or skin. Loss of menstruation. Fatigue. Enlarged thyroid gland or a lump in the thyroid (nodule). You may also have symptoms of Graves' disease, which may include: Protruding eyes. Dry eyes. Red or swollen eyes. Problems with vision. How is this diagnosed? This condition may be diagnosed based on: Your symptoms and medical  history. A physical exam. Blood tests. Thyroid ultrasound. This test involves using sound waves to produce images of the thyroid gland. A thyroid scan. A radioactive substance is injected into a vein, and images show how much iodine is present in the thyroid. Radioactive iodine uptake test (RAIU). A small amount of radioactive iodine is given by mouth to see how much iodine the thyroid absorbs after a certain amount of time. How is this treated? Treatment depends on the cause and severity of the condition. Treatment may include: Medicines to reduce the amount of thyroid hormone your body makes. Medicines to help manage your symptoms. Radioactive iodine treatment (radioiodine therapy). This involves swallowing a small dose of radioactive iodine, in capsule or liquid form, to kill thyroid cells. Surgery to remove part or all of your thyroid gland. You may need to take thyroid hormone replacement medicine for the rest of your life after thyroid surgery. Follow these instructions at home:  Take over-the-counter and prescription medicines only as told by your health care provider. Do not use any products that contain nicotine or tobacco. These products include cigarettes, chewing tobacco, and vaping devices, such as e-cigarettes. If you need help quitting, ask your health care provider. Follow any instructions from your health care provider about diet. You may be instructed to limit foods that contain iodine. Keep all follow-up visits. You will need to have blood tests regularly so that your health care provider can monitor your condition. Where to find more information National Institute of Diabetes and Digestive and Kidney Diseases: niddk.nih.gov Contact a health care provider if: Your symptoms do not get better with treatment. You have a fever. You have abdominal pain. You feel dizzy. You are taking thyroid hormone replacement medicine and: You have   symptoms of depression. You feel like you  are tired all the time. You gain weight. Get help right away if: You have sudden, unexplained confusion or other mental changes. You have chest pain. You have fast or irregular heartbeats (palpitations). You have difficulty breathing. These symptoms may be an emergency. Get help right away. Call 911. Do not wait to see if the symptoms will go away. Do not drive yourself to the hospital. Summary The thyroid gland is a small gland located in the lower front part of the neck, just in front of the windpipe. Hyperthyroidism is when the thyroid gland is too active and produces too much of a hormone called thyroxine. The most common cause is Graves' disease, a disorder in which your immune system attacks the thyroid gland. Hyperthyroidism can cause various symptoms, such as unexplained weight loss, nervousness, inability to tolerate heat, or changes in your heartbeat. Treatment may include medicine to reduce the amount of thyroid hormone your body makes, radioiodine therapy, surgery, or medicines to manage symptoms. This information is not intended to replace advice given to you by your health care provider. Make sure you discuss any questions you have with your health care provider. Document Revised: 10/30/2021 Document Reviewed: 10/30/2021 Elsevier Patient Education  2024 [employer]  

## 2023-08-08 ENCOUNTER — Ambulatory Visit: Payer: Medicare Other | Admitting: Nurse Practitioner

## 2023-08-08 ENCOUNTER — Encounter: Payer: Self-pay | Admitting: Nurse Practitioner

## 2023-08-08 VITALS — BP 142/72 | HR 75 | Ht 68.0 in | Wt 191.2 lb

## 2023-08-08 DIAGNOSIS — E042 Nontoxic multinodular goiter: Secondary | ICD-10-CM

## 2023-08-08 DIAGNOSIS — R7989 Other specified abnormal findings of blood chemistry: Secondary | ICD-10-CM | POA: Diagnosis not present

## 2023-08-08 NOTE — Progress Notes (Signed)
08/08/2023     Endocrinology Consult Note    Subjective:    Patient ID: Amanda Case, female    DOB: 1946-05-20, PCP Wendall Papa   Past Medical History:  Diagnosis Date   History of kidney stones    Hypercholesterolemia    Hypertension     Past Surgical History:  Procedure Laterality Date   ABDOMINAL HYSTERECTOMY     CYSTOSCOPY N/A 03/12/2021   Procedure: CYSTOSCOPY WITH FLUOROSCOPY;  Surgeon: Malen Gauze, MD;  Location: [facility];  Service: Urology;  Laterality: N/A;   CYSTOSCOPY WITH RETROGRADE PYELOGRAM, URETEROSCOPY AND STENT PLACEMENT Right 12/19/2020   Procedure: CYSTOSCOPY WITH RETROGRADE PYELOGRAM, URETEROSCOPY DIAGNOSTIC;  Surgeon: Malen Gauze, MD;  Location: [facility];  Service: Urology;  Laterality: Right;   IR NEPHROSTOMY PLACEMENT RIGHT  12/23/2020   IR NEPHROSTOMY TUBE CHANGE  02/17/2021   URETERAL REIMPLANTION Right 03/12/2021   Procedure: URETERAL REIMPLANT;  Surgeon: Malen Gauze, MD;  Location: [facility];  Service: Urology;  Laterality: Right;   XI ROBOTIC ASSISTED VENTRAL HERNIA N/A 02/28/2023   Procedure: XI ROBOTIC ASSISTED VENTRAL HERNIA;  Surgeon: Franky Macho, MD;  Location: [facility];  Service: General;  Laterality: N/A;    Social History   Socioeconomic History   Marital status: Married    Spouse name: Not on file   Number of children: 2   Years of education: Not on file   Highest education level: Not on file  Occupational History   Occupation: retired  Tobacco Use   Smoking status: Never   Smokeless tobacco: Never  Vaping Use   Vaping status: Never Used  Substance and Sexual Activity   Alcohol use: No   Drug use: No   Sexual activity: Yes  Other Topics Concern   Not on file  Social History Narrative   Not on file   Social Determinants of Health   Financial Resource Strain: Not on file  Food Insecurity: Not on file  Transportation Needs: Not on file  Physical Activity: Not on file  Stress: Not on file   Social Connections: Not on file    History reviewed. No pertinent family history.  Outpatient Encounter Medications as of 08/08/2023  Medication Sig   aspirin EC 81 MG tablet Take 81 mg by mouth daily. Swallow whole.   cholecalciferol (VITAMIN D3) 25 MCG (1000 UNIT) tablet Take 1,000 Units by mouth daily.   hydrochlorothiazide (MICROZIDE) 12.5 MG capsule Take 12.5 mg by mouth daily.   losartan (COZAAR) 100 MG tablet Take 100 mg by mouth daily.   rosuvastatin (CRESTOR) 10 MG tablet Take 10 mg by mouth at bedtime.   vitamin B-12 (CYANOCOBALAMIN) 500 MCG tablet Take 500 mcg by mouth daily.   traMADol (ULTRAM) 50 MG tablet Take 1 tablet (50 mg total) by mouth every 6 (six) hours as needed. (Patient not taking: Reported on 03/08/2023)   No facility-administered encounter medications on file as of 08/08/2023.    ALLERGIES: Allergies  Allergen Reactions   Cipro [Ciprofloxacin-Ciproflox Hcl Er] Hives    VACCINATION STATUS: Immunization History  Administered Date(s) Administered   Influenza-Unspecified 08/04/2018   Pneumococcal Conjugate-13 11/28/2012     HPI  Amanda Case is 77 y.o. female who presents today with a medical history as above. she is being seen in consultation for hyperthyroidism requested by Wendall Papa.  she has been dealing with symptoms of irritability, weight loss (has been trying), thinning hair, and palpitations for about 2 months.  These symptoms are progressively worsening and troubling to her.  her most recent thyroid labs revealed suppressed TSH of 0.09 on 04/07/23.  she denies dysphagia, choking, shortness of breath, no recent voice change.  She does note that she feels that her existing thyroid nodules have changed in size.  She notes she was seeing Dr. Suszanne Conners but he moved to [facility] location which is too far for her to drive.  He had done serial thyroid ultrasounds in the past to assess for MNG, has had biopsy in the past, determined to be benign.    she does have family history of thyroid dysfunction in her mom (goiter) and sister (goiter requiring partial thyroidectomy), but denies family hx of thyroid cancer. she denies personal history of goiter. she is not on any anti-thyroid medications nor on any thyroid hormone supplements. Denies use of Biotin containing supplements.  she is willing to proceed with appropriate work up and therapy for thyrotoxicosis.   Review of systems  Constitutional: + decreasing body weight (intentional per patient), current Body mass index is 29.07 kg/m., no fatigue, no subjective hyperthermia, no subjective hypothermia Eyes: no blurry vision, no xerophthalmia ENT: no sore throat, no nodules palpated in throat, no dysphagia/odynophagia, no hoarseness Cardiovascular: no chest pain, no shortness of breath, + intermittent palpitations, no leg swelling Respiratory: no cough, no shortness of breath Gastrointestinal: no nausea/vomiting/diarrhea Musculoskeletal: no muscle/joint aches Skin: no rashes, no hyperemia, + thinning hair Neurological: no tremors, no numbness, no tingling, no dizziness Psychiatric: no depression, no anxiety, + irritability.   Objective:    BP (!) 142/72 (BP Location: Left Arm, Patient Position: Sitting, Cuff Size: Large) Comment: Retake BP with manuel cuff. Patient states that she has taken her BP Medication. She is anxious as she is new to our practice. She follows Ms. Harfield at [facility] made aware.  Pulse 75   Ht 5\' 8"  (1.727 m)   Wt 191 lb 3.2 oz (86.7 kg)   BMI 29.07 kg/m   Wt Readings from Last 3 Encounters:  08/08/23 191 lb 3.2 oz (86.7 kg)  03/08/23 192 lb (87.1 kg)  02/28/23 192 lb 14.4 oz (87.5 kg)     BP Readings from Last 3 Encounters:  08/08/23 (!) 142/72  03/08/23 133/75  02/28/23 (!) 141/67                          Physical Exam- Limited  Constitutional:  Body mass index is 29.07 kg/m. , not in acute distress, normal state of mind Eyes:   EOMI, no exophthalmos Neck: Supple Thyroid: L>R Cardiovascular: RRR, no murmurs, rubs, or gallops, no edema Respiratory: Adequate breathing efforts, no crackles, rales, rhonchi, or wheezing Musculoskeletal: no gross deformities, strength intact in all four extremities, no gross restriction of joint movements Skin:  no rashes, no hyperemia Neurological: + tremor with outstretched hands, DTR normal in BLE   CMP     Component Value Date/Time   NA 136 02/15/2023 1014   NA 141 01/21/2021 1257   K 3.8 02/15/2023 1014   CL 98 02/15/2023 1014   CO2 22 02/15/2023 1014   GLUCOSE 166 (H) 02/15/2023 1014   BUN 22 02/15/2023 1014   BUN 15 01/21/2021 1257   CREATININE 1.28 (H) 02/15/2023 1014   CREATININE 1.00 (H) 11/14/2019 1621   CALCIUM 10.0 02/15/2023 1014   PROT 8.4 (H) 02/15/2023 1014   ALBUMIN 4.4 02/15/2023 1014   AST 16 02/15/2023 1014   ALT  13 02/15/2023 1014   ALKPHOS 69 02/15/2023 1014   BILITOT 1.1 02/15/2023 1014   EGFR 43 (L) 01/21/2021 1257   GFRNONAA 44 (L) 02/15/2023 1014     CBC    Component Value Date/Time   WBC 15.6 (H) 02/15/2023 1014   RBC 4.97 02/15/2023 1014   HGB 15.0 02/15/2023 1014   HCT 45.6 02/15/2023 1014   PLT 296 02/15/2023 1014   MCV 91.8 02/15/2023 1014   MCH 30.2 02/15/2023 1014   MCHC 32.9 02/15/2023 1014   RDW 12.8 02/15/2023 1014   LYMPHSABS 0.7 02/15/2023 1014   MONOABS 0.7 02/15/2023 1014   EOSABS 0.0 02/15/2023 1014   BASOSABS 0.0 02/15/2023 1014     Diabetic Labs (most recent): No results found for: "HGBA1C", "MICROALBUR"  Lipid Panel  No results found for: "CHOL", "TRIG", "HDL", "CHOLHDL", "VLDL", "LDLCALC", "LDLDIRECT", "LABVLDL"   Lab Results  Component Value Date   TSH 0.09 (A) 04/07/2023    Thyroid US from 04/13/22 CLINICAL DATA:  Goiter. Multinodular thyroid gland. Patient previously underwent biopsy of right mid and left mid thyroid nodules in September of 2018.   EXAM: THYROID ULTRASOUND    TECHNIQUE: Ultrasound examination of the thyroid gland and adjacent soft tissues was performed.   COMPARISON:  Prior thyroid ultrasound 12/12/2020   FINDINGS: Parenchymal Echotexture: Markedly heterogenous   Isthmus: 0.9 cm   Right lobe: 5.3 x 1.8 x 2.2 cm   Left lobe: 6.6 x 3.5 x 4.2 cm   _________________________________________________________   Estimated total number of nodules >/= 1 cm: 5   Number of spongiform nodules >/=  2 cm not described below (TR1): 0   Number of mixed cystic and solid nodules >/= 1.5 cm not described below (TR2): 0   _________________________________________________________   Nodule # 1: Previously biopsied nodule in the right mid gland demonstrates no interval change at 2.0 x 1.6 x 1.3 cm compared to 2.2 x 1.6 x 1.1 cm previously.   Nodule # 5: No significant interval change in the size or appearance of the previously biopsied mass in the left mid gland which measures 4.7 x 4.1 x 3.8 cm compared to 4.5 x 3.9 x 3.4 cm.   Additional small isoechoic and spongiform thyroid nodules noted scattered throughout the right gland. None meet criteria to warrant further evaluation. No new nodules or suspicious features.   IMPRESSION: 1. Continued stability of previously biopsied nodules in the right mid and left mid gland. Assuming previously benign pathologic results, no further follow-up is required. 2. No new nodules or suspicious features.   The above is in keeping with the ACR TI-RADS recommendations - J Am Coll Radiol 2017;14:587-595.     Electronically Signed   By: Malachy Moan M.D.   On: 04/13/2022 16:52    Assessment & Plan:   1. Abnormal TSH 2. Multinodular thyroid  she is being seen at a kind request of Hairfield, Keavie C.  her history and most recent labs are reviewed, and she was examined clinically. Subjective and objective findings are consistent with thyrotoxicosis likely from primary hyperthyroidism. The potential  risks of untreated thyrotoxicosis and the need for definitive therapy have been discussed in detail with her, and she agrees to proceed with diagnostic workup and treatment plan.   I will repeat full profile thyroid function tests today (including antibody testing to assess for autoimmune thyroid dysfunction).  Will wait for labs to return before ordering imaging studies.  If labs suggestive of over-activity, will order uptake and  scan, if under-active, will reorder thyroid ultrasound given her recent account of her thyroid nodules growing.  I will call her with results and plan moving forward.   Options of therapy are discussed with her.  We discussed the option of treating it with medications including methimazole or PTU which may have side effects including rash, transaminitis, and bone marrow suppression.  We also discussed the option of definitive therapy with RAI ablation of the thyroid. If she is found to have primary hyperthyroidism from Graves' disease , toxic multinodular goiter or toxic nodular goiter the preferred modality of treatment would be I-131 thyroid ablation. Surgery is another choice of treatment in some cases, in her case surgery is not a good fit for presentation with only mild goiter.  -Patient is made aware of the high likelihood of post ablative hypothyroidism with subsequent need for lifelong thyroid hormone replacement. sheunderstands this outcome and she is  willing to proceed.         -Patient is advised to maintain close follow up with [facility] for primary care needs.   - Time spent with the patient: 60 minutes, of which >50% was spent in obtaining information about her symptoms, reviewing her previous labs, evaluations, and treatments, counseling her about her hyperthyroidism , and developing a plan to confirm the diagnosis and long term treatment as necessary. Please refer to "Patient Self Inventory" in the Media tab for reviewed elements of pertinent  patient history.  Amanda Case participated in the discussions, expressed understanding, and voiced agreement with the above plans.  All questions were answered to her satisfaction. she is encouraged to contact clinic should she have any questions or concerns prior to her return visit.   Follow up plan: Return will call with thyroid lab results and plan moving forward..   Thank you for involving me in the care of this pleasant patient, and I will continue to update you with her progress.    Ronny Bacon, [facility] [address] [place], [place] [address] Phone: [phone] Fax: [phone]  08/08/2023, 10:04 AM

## 2023-08-09 ENCOUNTER — Encounter: Payer: Self-pay | Admitting: Nurse Practitioner

## 2023-08-09 ENCOUNTER — Telehealth: Payer: Self-pay | Admitting: *Deleted

## 2023-08-09 ENCOUNTER — Other Ambulatory Visit: Payer: Self-pay | Admitting: Nurse Practitioner

## 2023-08-09 DIAGNOSIS — E042 Nontoxic multinodular goiter: Secondary | ICD-10-CM

## 2023-08-09 LAB — THYROGLOBULIN ANTIBODY: Thyroglobulin Antibody: <1 [IU]/mL (ref 0.0–0.9)

## 2023-08-09 LAB — THYROID PEROXIDASE ANTIBODY: Thyroperoxidase Ab SerPl-aCnc: <9 [IU]/mL (ref 0–34)

## 2023-08-09 LAB — T3, FREE: T3, Free: 3.9 pg/mL (ref 2.0–4.4)

## 2023-08-09 LAB — T4, FREE: Free T4: 0.93 ng/dL (ref 0.82–1.77)

## 2023-08-09 LAB — TSH: TSH: 1.26 u[IU]/mL (ref 0.450–4.500)

## 2023-08-09 NOTE — Telephone Encounter (Signed)
FYI: I sent mychart message to patient going over recent thyroid labs.  I did order ultrasound to be done and I will reach back out with those results as well.  Noted that Amanda Case has sent this message to the patient.

## 2023-08-09 NOTE — Progress Notes (Signed)
FYI: I sent mychart message to patient going over recent thyroid labs.  I did order ultrasound to be done and I will reach back out with those results as well.

## 2023-08-09 NOTE — Telephone Encounter (Signed)
-----   Message from Dani Gobble sent at 08/09/2023  7:58 AM EST ----- FYI: I sent mychart message to patient going over recent thyroid labs.  I did order ultrasound to be done and I will reach back out with those results as well.

## 2023-08-15 DIAGNOSIS — M1712 Unilateral primary osteoarthritis, left knee: Secondary | ICD-10-CM | POA: Diagnosis not present

## 2023-08-15 DIAGNOSIS — S83242A Other tear of medial meniscus, current injury, left knee, initial encounter: Secondary | ICD-10-CM | POA: Diagnosis not present

## 2023-08-24 ENCOUNTER — Encounter: Payer: Self-pay | Admitting: Urology

## 2023-08-24 ENCOUNTER — Ambulatory Visit: Payer: Medicare Other | Admitting: Urology

## 2023-08-24 ENCOUNTER — Ambulatory Visit (HOSPITAL_COMMUNITY)
Admission: RE | Admit: 2023-08-24 | Discharge: 2023-08-24 | Disposition: A | Discharge status: Home / Self Care | Payer: Medicare Other | Source: Ambulatory Visit | Attending: Nurse Practitioner | Admitting: Nurse Practitioner

## 2023-08-24 VITALS — BP 150/71 | HR 85

## 2023-08-24 DIAGNOSIS — N131 Hydronephrosis with ureteral stricture, not elsewhere classified: Secondary | ICD-10-CM | POA: Diagnosis not present

## 2023-08-24 DIAGNOSIS — E042 Nontoxic multinodular goiter: Secondary | ICD-10-CM | POA: Insufficient documentation

## 2023-08-24 LAB — URINALYSIS, ROUTINE W REFLEX MICROSCOPIC
Bilirubin, UA: NEGATIVE
Glucose, UA: NEGATIVE
Ketones, UA: NEGATIVE
Leukocytes,UA: NEGATIVE
Nitrite, UA: NEGATIVE
Protein,UA: NEGATIVE
RBC, UA: NEGATIVE
Specific Gravity, UA: 1.02 (ref 1.005–1.030)
Urobilinogen, Ur: 0.2 mg/dL (ref 0.2–1.0)
pH, UA: 7 (ref 5.0–7.5)

## 2023-08-24 NOTE — Patient Instructions (Signed)
Hydronephrosis  Hydronephrosis is the swelling of one or both kidneys due to a blockage that stops urine from flowing out of the body. Kidneys filter waste from the blood and produce urine. This condition can lead to kidney failure and may become life-threatening if not treated promptly. What are the causes? In infants and children, common causes include problems that occur when a baby is developing in the womb. These can include problems in the kidneys or in the tubes that drain urine into the bladder (ureters). In adults, common causes include: Kidney stones. Pregnancy. A tumor or cyst in the abdomen or pelvis. An enlarged prostate gland. Other causes include: Bladder infection. Scar tissue from a previous surgery or injury. A blood clot. Cancer of the prostate, bladder, uterus, ovary, or colon. What are the signs or symptoms? Symptoms of this condition include: Pain or discomfort in your side (flank) or abdomen. Swelling in your abdomen. Nausea and vomiting. Fever. Pain when passing urine. Feelings of urgency when you need to urinate. Urinating more often than normal. In some cases, you may not have any symptoms. How is this diagnosed? This condition may be diagnosed based on: Your symptoms and medical history. A physical exam. Blood and urine tests. Imaging tests, such as an ultrasound, CT scan, or MRI. A procedure to look at your urinary tract and bladder by inserting a scope into the urethra (cystoscopy). How is this treated? Treatment for this condition depends on where the blockage is, how long it has been there, and what caused it. The goal of treatment is to remove the blockage. Treatment may include: Antibiotic medicines to treat or prevent infection. A procedure to place a small, thin tube (stent) into a blocked ureter. The stent will keep the ureter open so that urine can drain through it. A nonsurgical procedure that crushes kidney stones with shock waves  (extracorporeal shock wave lithotripsy). If kidney failure occurs, treatment may include dialysis or a kidney transplant. Follow these instructions at home:  Take over-the-counter and prescription medicines only as told by your health care provider. If you were prescribed an antibiotic medicine, take it exactly as told by your health care provider. Do not stop taking the antibiotic even if you start to feel better. Rest and return to your normal activities as told by your health care provider. Ask your health care provider what activities are safe for you. Drink enough fluid to keep your urine pale yellow. Keep all follow-up visits. This is important. Contact a health care provider if: You continue to have symptoms after treatment. You develop new symptoms. Your urine becomes cloudy or bloody. You have a fever. Get help right away if: You have severe flank or abdominal pain. You cannot drink fluids without vomiting. Summary Hydronephrosis is the swelling of one or both kidneys due to a blockage that stops urine from flowing out of the body. Hydronephrosis can lead to kidney failure and may become life-threatening if not treated promptly. The goal of treatment is to remove the blockage. It may include a procedure to insert a stent into a blocked ureter, a procedure to break up kidney stones, or taking antibiotic medicines. Follow your health care provider's instructions for taking care of yourself at home, including instructions about drinking fluids, taking medicines, and limiting activities. This information is not intended to replace advice given to you by your health care provider. Make sure you discuss any questions you have with your health care provider. Document Revised: 12/25/2019 Document Reviewed: 12/25/2019 Elsevier   Patient Education  2024 [employer]  

## 2023-08-24 NOTE — Progress Notes (Signed)
08/24/2023 11:21 AM   Amanda Case 10-30-1945 [id number]  Referring provider: Wendall Case [address] [place],  [place] [address]  Followup right hydronephrosis   HPI: [name] is a 77o here for followup for right ureteral stricture/right hydronephrosis. Renal US 11/25 showed no right hydronephrosis. She denies nay flank pain. No worsening LUTS.    PMH: Past Medical History:  Diagnosis Date   History of kidney stones    Hypercholesterolemia    Hypertension     Surgical History: Past Surgical History:  Procedure Laterality Date   ABDOMINAL HYSTERECTOMY     CYSTOSCOPY N/A 03/12/2021   Procedure: CYSTOSCOPY WITH FLUOROSCOPY;  Surgeon: Amanda Gauze, MD;  Location: [facility];  Service: Urology;  Laterality: N/A;   CYSTOSCOPY WITH RETROGRADE PYELOGRAM, URETEROSCOPY AND STENT PLACEMENT Right 12/19/2020   Procedure: CYSTOSCOPY WITH RETROGRADE PYELOGRAM, URETEROSCOPY DIAGNOSTIC;  Surgeon: Amanda Gauze, MD;  Location: [facility];  Service: Urology;  Laterality: Right;   IR NEPHROSTOMY PLACEMENT RIGHT  12/23/2020   IR NEPHROSTOMY TUBE CHANGE  02/17/2021   URETERAL REIMPLANTION Right 03/12/2021   Procedure: URETERAL REIMPLANT;  Surgeon: Amanda Gauze, MD;  Location: [facility];  Service: Urology;  Laterality: Right;   XI ROBOTIC ASSISTED VENTRAL HERNIA N/A 02/28/2023   Procedure: XI ROBOTIC ASSISTED VENTRAL HERNIA;  Surgeon: Amanda Macho, MD;  Location: [facility];  Service: General;  Laterality: N/A;    Home Medications:  Allergies as of 08/24/2023       Reactions   Cipro [ciprofloxacin-ciproflox Hcl Er] Hives        Medication List        Accurate as of August 24, 2023 11:21 AM. If you have any questions, ask your nurse or doctor.          STOP taking these medications    traMADol 50 MG tablet Commonly known as: Ultram       TAKE these medications    aspirin EC 81 MG tablet Take 81 mg by mouth daily. Swallow whole.   cholecalciferol 25 MCG (1000  UNIT) tablet Commonly known as: VITAMIN D3 Take 1,000 Units by mouth daily.   hydrochlorothiazide 12.5 MG capsule Commonly known as: MICROZIDE Take 12.5 mg by mouth daily.   losartan 100 MG tablet Commonly known as: COZAAR Take 100 mg by mouth daily.   rosuvastatin 10 MG tablet Commonly known as: CRESTOR Take 10 mg by mouth at bedtime.   vitamin B-12 500 MCG tablet Commonly known as: CYANOCOBALAMIN Take 500 mcg by mouth daily.        Allergies:  Allergies  Allergen Reactions   Cipro [Ciprofloxacin-Ciproflox Hcl Er] Hives    Family History: No family history on file.  Social History:  reports that she has never smoked. She has never used smokeless tobacco. She reports that she does not drink alcohol and does not use drugs.  ROS: All other review of systems were reviewed and are negative except what is noted above in HPI  Physical Exam: BP (!) 150/71   Pulse 85   Constitutional:  Alert and oriented, No acute distress. HEENT: [place] AT, moist mucus membranes.  Trachea midline, no masses. Cardiovascular: No clubbing, cyanosis, or edema. Respiratory: Normal respiratory effort, no increased work of breathing. GI: Abdomen is soft, nontender, nondistended, no abdominal masses GU: No CVA tenderness.  Lymph: No cervical or inguinal lymphadenopathy. Skin: No rashes, bruises or suspicious lesions. Neurologic: Grossly intact, no focal deficits, moving all 4 extremities. Psychiatric: Normal mood and affect.  Laboratory Data: Lab Results  Component Value Date   WBC 15.6 (H) 02/15/2023   HGB 15.0 02/15/2023   HCT 45.6 02/15/2023   MCV 91.8 02/15/2023   PLT 296 02/15/2023    Lab Results  Component Value Date   CREATININE 1.28 (H) 02/15/2023    No results found for: "PSA"  No results found for: "TESTOSTERONE"  No results found for: "HGBA1C"  Urinalysis    Component Value Date/Time   COLORURINE YELLOW 02/15/2023 1014   APPEARANCEUR CLOUDY (A) 02/15/2023 1014    APPEARANCEUR Clear 08/24/2022 1425   LABSPEC 1.024 02/15/2023 1014   PHURINE 5.0 02/15/2023 1014   GLUCOSEU NEGATIVE 02/15/2023 1014   HGBUR NEGATIVE 02/15/2023 1014   BILIRUBINUR NEGATIVE 02/15/2023 1014   BILIRUBINUR Negative 08/24/2022 1425   KETONESUR NEGATIVE 02/15/2023 1014   PROTEINUR 30 (A) 02/15/2023 1014   UROBILINOGEN negative (A) 01/02/2020 0913   NITRITE NEGATIVE 02/15/2023 1014   LEUKOCYTESUR NEGATIVE 02/15/2023 1014    Lab Results  Component Value Date   LABMICR Comment 08/24/2022   WBCUA 0-5 08/28/2021   LABEPIT 0-10 08/28/2021   BACTERIA RARE (A) 02/15/2023    Pertinent Imaging: Renal US 08/04/2023: Images reviewed and discussed with the patient  No results found for this or any previous visit.  No results found for this or any previous visit.  No results found for this or any previous visit.  No results found for this or any previous visit.  Results for orders placed during the hospital encounter of 08/04/23  Ultrasound renal complete  Narrative : PROCEDURE: US RENAL  HISTORY: Patient is a 77 y/o  F with hydronephrosis.  COMPARISON: CT abdomen/pelvis 02/15/2023, U/S renal 11/09/2021, 02/25/2022.  TECHNIQUE: Two-dimensional grayscale and color Doppler ultrasound of the kidneys was performed.  FINDINGS: The urinary bladder demonstrates normal anechoic echogenicity without wall thickening. The left ureteral jet is visualized, right is not seen.  The right kidney measures 8.5 cm. Renal cortical echotexture is increased and thinned. There is no hydronephrosis. There are no stones. There are no cysts.  The left kidney measures 9.3 cm. Renal cortical echotexture is increased. There is no hydronephrosis. There are no stones. There is 1.3 cm mid parapelvic cyst.  IMPRESSION: 1. Increased echogenicity of the bilateral renal cortices with left-sided cyst, consistent with medical renal disease.  2.  Nonvisualization of the right ureteral  jet.  Thank you for allowing [place] to assist in the care of this patient.   Electronically Signed By: Amanda Case M.D. On: 08/15/2023 12:26  No valid procedures specified. Results for orders placed during the hospital encounter of 12/06/19  CT HEMATURIA WORKUP  Narrative CLINICAL DATA:  Chronic UTI for 2 years. Hematuria.  EXAM: CT ABDOMEN AND PELVIS WITHOUT AND WITH CONTRAST  TECHNIQUE: Multidetector CT imaging of the abdomen and pelvis was performed following the standard protocol before and following the bolus administration of intravenous contrast.  CONTRAST:  OMNIPAQUE IOHEXOL 300 MG/ML  SOLN  COMPARISON:  No prior images are available for review. Report from 2018 is noted.  FINDINGS: Lower chest: Basilar atelectasis. No effusion.  Hepatobiliary: Mildly lobular hepatic contours. No signs of focal hepatic lesion. Biliary tree is normal. Portal vein is patent.  Pancreas: Pancreas without signs of inflammation, ductal dilation or focal lesion.  Spleen: Spleen is normal size without focal lesion.  Adrenals/Urinary Tract: Adrenal glands are normal.  Renal enhancement is symmetric. No evidence of hydronephrosis. Urinary bladder displaced by large pelvic mass. No suspicious renal lesion.  Excretory phase with limited assessment of the distal ureters. No secondary signs of stricture. The no lesion in the upper tracts.  No urinary tract calculus.  Stomach/Bowel: Small hiatal hernia. No bowel obstruction or acute bowel process. Normal appendix. Sigmoid diverticulosis.  Vascular/Lymphatic: Atheromatous changes in the abdominal aorta. No aneurysm. No adenopathy in the retroperitoneum or in the upper abdomen. No pelvic nodal enlargement. Hazy central small bowel mesentery with scattered small lymph nodes.  Reproductive: Uterus is not identifiable as a separate structure. There is a large mass in the pelvis with some subtle peripheral calcification. Lesion  does not show infiltrative margins and measures approximately 15.1 x 14.7 x 11.2 cm. The uterus appears to be displaced along the left lateral margin of the mass.  The right and left ovary are likely also displaced but not well defined as discrete structures on the current CT study with engorgement of ovarian veins more so on the right than the left.  Other: No ascites.  Musculoskeletal: Spinal degenerative changes without acute or destructive bone finding.  IMPRESSION: 1. 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 There are no infiltrative margins aside from the inability to separate discrete pelvic reproductive structures from this process. Also the absence of adenopathy is reassuring. There is an ultrasound report available from 2007 that also references a mass. Correlate with any surgeries in the interim and with follow-up pelvic imaging with ultrasound or MRI as warranted. 2. No evidence of urinary tract calculus or obstruction. No signs of upper tract lesion to explain hematuria though the mid right ureter and distal right ureter show limited assessment due to lack of opacification. 3. Mildly lobular hepatic contours may reflect underlying cirrhosis. No focal hepatic lesion. 4. Sigmoid diverticulosis. 5. Small hiatal hernia. 6. Aortic atherosclerosis.  Aortic Atherosclerosis (ICD10-I70.0).   Electronically Signed By: Donzetta Kohut M.D. On: 12/07/2019 09:09  Results for orders placed during the hospital encounter of 02/18/21  CT Renal Stone Study  Narrative CLINICAL DATA:  Flank pain, kidney stone, fever, recent nephrostomy tube change  EXAM: CT ABDOMEN AND PELVIS WITHOUT CONTRAST  TECHNIQUE: Multidetector CT imaging of the abdomen and pelvis was performed following the standard protocol without IV contrast. Sagittal and coronal MPR images reconstructed from axial data set.  COMPARISON:   12/12/2020  FINDINGS: Lower chest: Subsegmental atelectasis LEFT lower lobe. RIGHT lung base clear.  Hepatobiliary: Gallbladder and liver normal appearance  Pancreas: Normal appearance  Spleen: Normal appearance  Adrenals/Urinary Tract: Adrenal glands, LEFT kidney, and LEFT ureter normal appearance. RIGHT nephrostomy tube present. No definite RIGHT renal mass or hydronephrosis. RIGHT ureter decompressed without identified urinary tract calcification. Bladder unremarkable.  Stomach/Bowel: Normal appendix. Diverticulosis of sigmoid colon without evidence of diverticulitis. Few ascending colonic diverticula. And remaining bowel loops unremarkable.  Vascular/Lymphatic: Atherosclerotic calcifications aorta without aneurysm. No adenopathy.  Reproductive: Uterus surgically absent. Nonvisualization of ovaries.  Other: No free air or free fluid. Minimal stranding in small-bowel mesentery question mint fibrosing mesenteritis unchanged. No hernia or acute inflammatory process.  Musculoskeletal: Bones demineralized with degenerative disc disease changes lumbar spine.  IMPRESSION: RIGHT nephrostomy tube without hydronephrosis, hydroureter, or adjacent fluid collection.  Colonic diverticulosis without evidence of diverticulitis.  No acute intra-abdominal or intrapelvic abnormalities.  Aortic Atherosclerosis (ICD10-I70.0).   Electronically Signed By: Ulyses Southward M.D. On: 02/18/2021 22:00   Assessment & Plan:    1. Hydronephrosis with ureteral stricture, not elsewhere classified Followup 1 year with  a renal US - Urinalysis, Routine w reflex microscopic   No follow-ups on file.  Wilkie Aye, MD  [facility]

## 2023-08-26 ENCOUNTER — Encounter: Payer: Self-pay | Admitting: Nurse Practitioner

## 2023-08-26 DIAGNOSIS — E042 Nontoxic multinodular goiter: Secondary | ICD-10-CM

## 2023-08-26 DIAGNOSIS — R7989 Other specified abnormal findings of blood chemistry: Secondary | ICD-10-CM

## 2023-08-26 NOTE — Progress Notes (Signed)
FYI: I sent in mychart message going over recent thyroid ultrasound.  I approached her about re-biopsying her 5.7 cm Left mid thyroid nodule which has grown some since last check.

## 2023-08-29 ENCOUNTER — Telehealth: Payer: Self-pay | Admitting: *Deleted

## 2023-08-29 ENCOUNTER — Other Ambulatory Visit: Payer: Self-pay | Admitting: Nurse Practitioner

## 2023-08-29 DIAGNOSIS — E042 Nontoxic multinodular goiter: Secondary | ICD-10-CM

## 2023-08-29 NOTE — Telephone Encounter (Signed)
Noted that Amanda Case has sent the patient a My Chart message going over her recent Thyroid Ultrasound.

## 2023-08-29 NOTE — Telephone Encounter (Signed)
-----   Message from Dani Gobble sent at 08/26/2023 10:45 AM EST ----- FYI: I sent in mychart message going over recent thyroid ultrasound.  I approached her about re-biopsying her 5.7 cm Left mid thyroid nodule which has grown some since last check.

## 2023-09-22 ENCOUNTER — Ambulatory Visit: Payer: Medicare Other | Admitting: Nurse Practitioner

## 2023-09-30 ENCOUNTER — Encounter (HOSPITAL_COMMUNITY): Payer: Self-pay

## 2023-09-30 ENCOUNTER — Ambulatory Visit (HOSPITAL_COMMUNITY)
Admission: RE | Admit: 2023-09-30 | Discharge: 2023-09-30 | Disposition: A | Discharge status: Home / Self Care | Payer: Medicare Other | Source: Ambulatory Visit | Attending: Nurse Practitioner | Admitting: Nurse Practitioner

## 2023-09-30 DIAGNOSIS — E041 Nontoxic single thyroid nodule: Secondary | ICD-10-CM | POA: Diagnosis not present

## 2023-09-30 MED ORDER — LIDOCAINE HCL (PF) 2 % IJ SOLN
INTRAMUSCULAR | Status: AC
  Filled 2023-09-30: qty 10

## 2023-09-30 MED ORDER — LIDOCAINE HCL (PF) 2 % IJ SOLN
10.0000 mL | Freq: Once | INTRAMUSCULAR | Status: AC
  Administered 2023-09-30: 10 mL

## 2023-09-30 NOTE — Procedures (Signed)
 Interventional Radiology Procedure Note  Procedure:   US guided left thyroid nodule FNA  Complications: None Recommendations:  - Ok to shower tomorrow - Do not submerge for 7 days    Signed,  Yvone Neu. Loreta Ave, DO

## 2023-09-30 NOTE — Progress Notes (Signed)
 PT tolerated thyroid biopsy procedure well today. Labs and afirma obtained and sent for pathology by Richard from ultrasound. PT ambulatory at discharge with no acute distress noted and verbalized understanding of discharge instructions.

## 2023-10-05 ENCOUNTER — Other Ambulatory Visit: Payer: Self-pay | Admitting: Nurse Practitioner

## 2023-10-05 ENCOUNTER — Encounter: Payer: Self-pay | Admitting: Nurse Practitioner

## 2023-10-05 DIAGNOSIS — R7989 Other specified abnormal findings of blood chemistry: Secondary | ICD-10-CM

## 2023-10-05 LAB — CYTOLOGY - NON PAP

## 2023-10-05 NOTE — Progress Notes (Signed)
 FYI: I sent mychart message going over thyroid biopsy results.

## 2023-11-03 DIAGNOSIS — Z299 Encounter for prophylactic measures, unspecified: Secondary | ICD-10-CM | POA: Diagnosis not present

## 2023-11-03 DIAGNOSIS — I700 Atherosclerosis of aorta: Secondary | ICD-10-CM | POA: Diagnosis not present

## 2023-11-03 DIAGNOSIS — N1832 Chronic kidney disease, stage 3b: Secondary | ICD-10-CM | POA: Diagnosis not present

## 2023-11-03 DIAGNOSIS — I10 Essential (primary) hypertension: Secondary | ICD-10-CM | POA: Diagnosis not present

## 2023-11-03 DIAGNOSIS — N183 Chronic kidney disease, stage 3 unspecified: Secondary | ICD-10-CM | POA: Diagnosis not present

## 2023-12-12 ENCOUNTER — Encounter: Payer: Self-pay | Admitting: *Deleted

## 2023-12-15 ENCOUNTER — Ambulatory Visit: Payer: Medicare Other | Attending: Cardiology | Admitting: Cardiology

## 2023-12-15 ENCOUNTER — Other Ambulatory Visit: Payer: Self-pay | Admitting: Cardiology

## 2023-12-15 ENCOUNTER — Encounter: Payer: Self-pay | Admitting: Cardiology

## 2023-12-15 ENCOUNTER — Ambulatory Visit: Attending: Cardiology

## 2023-12-15 VITALS — BP 144/80 | HR 86 | Ht 67.0 in | Wt 193.4 lb

## 2023-12-15 DIAGNOSIS — R002 Palpitations: Secondary | ICD-10-CM

## 2023-12-15 DIAGNOSIS — I10 Essential (primary) hypertension: Secondary | ICD-10-CM | POA: Diagnosis not present

## 2023-12-15 NOTE — Progress Notes (Signed)
 Clinical Summary Amanda Case is a 78 y.o.female seen today as a new patient for the following medical problems.   1.Palpitations - started about 1 year ago - often feels in the evenings when sitting down, few times a week. No other related symptoms - typically lasts just a few seconds - 1 coffee in AM, 1 mountin dew 16 oz.    2. HTN - compliant with meds    Past Medical History:  Diagnosis Date   History of kidney stones    Hypercholesterolemia    Hypertension      Allergies  Allergen Reactions   Cipro [Ciprofloxacin-Ciproflox Hcl Er] Hives     Current Outpatient Medications  Medication Sig Dispense Refill   aspirin EC 81 MG tablet Take 81 mg by mouth daily. Swallow whole.     cholecalciferol (VITAMIN D3) 25 MCG (1000 UNIT) tablet Take 1,000 Units by mouth daily.     hydrochlorothiazide (MICROZIDE) 12.5 MG capsule Take 12.5 mg by mouth daily.     losartan (COZAAR) 100 MG tablet Take 100 mg by mouth daily.     rosuvastatin (CRESTOR) 10 MG tablet Take 10 mg by mouth at bedtime.     vitamin B-12 (CYANOCOBALAMIN) 500 MCG tablet Take 500 mcg by mouth daily.     No current facility-administered medications for this visit.     Past Surgical History:  Procedure Laterality Date   ABDOMINAL HYSTERECTOMY     CYSTOSCOPY N/A 03/12/2021   Procedure: CYSTOSCOPY WITH FLUOROSCOPY;  Surgeon: Malen Gauze, MD;  Location: [facility];  Service: Urology;  Laterality: N/A;   CYSTOSCOPY WITH RETROGRADE PYELOGRAM, URETEROSCOPY AND STENT PLACEMENT Right 12/19/2020   Procedure: CYSTOSCOPY WITH RETROGRADE PYELOGRAM, URETEROSCOPY DIAGNOSTIC;  Surgeon: Malen Gauze, MD;  Location: [facility];  Service: Urology;  Laterality: Right;   IR NEPHROSTOMY PLACEMENT RIGHT  12/23/2020   IR NEPHROSTOMY TUBE CHANGE  02/17/2021   URETERAL REIMPLANTION Right 03/12/2021   Procedure: URETERAL REIMPLANT;  Surgeon: Malen Gauze, MD;  Location: [facility];  Service: Urology;  Laterality: Right;   XI  ROBOTIC ASSISTED VENTRAL HERNIA N/A 02/28/2023   Procedure: XI ROBOTIC ASSISTED VENTRAL HERNIA;  Surgeon: Franky Macho, MD;  Location: [facility];  Service: General;  Laterality: N/A;     Allergies  Allergen Reactions   Cipro [Ciprofloxacin-Ciproflox Hcl Er] Hives      No family history on file.   Social History Amanda Case reports that she has never smoked. She has never used smokeless tobacco. Amanda Case reports no history of alcohol use.    Physical Examination Vitals:   12/15/23 1312 12/15/23 1344  BP: (!) 152/80 (!) 144/80  Pulse:    SpO2:     Filed Weights   12/15/23 1306  Weight: 193 lb 6.4 oz (87.7 kg)    Gen: resting comfortably, no acute distress HEENT: no scleral icterus, pupils equal round and reactive, no palptable cervical adenopathy,  CV: RRR, no mrg, no jvd Resp: Clear to auscultation bilaterally GI: abdomen is soft, non-tender, non-distended, normal bowel sounds, no hepatosplenomegaly MSK: extremities are warm, no edema.  Skin: warm, no rash Neuro:  no focal deficits Psych: appropriate affect    Assessment and Plan  1.Palpitations - EKG today shows NSR - plan for 2 week zio patch to further evaluate  2. HTN - elevated today, has been managed by pcp - if remains elevated at f/u would likely change hydrochlorothiazide to chlorthalidone 25mg  daily.   F/u 4-6 weeks with  NP Shan Levans, M.D.

## 2023-12-15 NOTE — Patient Instructions (Signed)
 Medication Instructions:  Your physician recommends that you continue on your current medications as directed. Please refer to the Current Medication list given to you today.  Labwork: None   Testing/Procedures: ZIO- Long Term Monitor Instructions   Your physician has requested you wear your ZIO patch monitor 14 days.   This is a single patch monitor.  [employer] supplies one patch monitor per enrollment.  Additional stickers are not available.   Please do not apply patch if you will be having a Nuclear Stress Test, Echocardiogram, Cardiac CT, MRI, or Chest Xray during the time frame you would be wearing the monitor. The patch cannot be worn during these tests.  You cannot remove and re-apply the ZIO XT patch monitor.   Your ZIO patch monitor will be sent USPS Priority mail from [facility] directly to your home address. The monitor may also be mailed to a PO BOX if home delivery is not available.   It may take 3-5 days to receive your monitor after you have been enrolled.   Once you have received you monitor, please review enclosed instructions.  Your monitor has already been registered assigning a specific monitor serial # to you.   Applying the monitor   Shave hair from upper left chest.   Hold abrader disc by orange tab.  Rub abrader in 40 strokes over left upper chest as indicated in your monitor instructions.   Clean area with 4 enclosed alcohol pads .  Use all pads to assure are is cleaned thoroughly.  Let dry.   Apply patch as indicated in monitor instructions.  Patch will be place under collarbone on left side of chest with arrow pointing upward.   Rub patch adhesive wings for 2 minutes.Remove white label marked "1".  Remove white label marked "2".  Rub patch adhesive wings for 2 additional minutes.   While looking in a mirror, press and release button in center of patch.  A small green light will flash 3-4 times .  This will be your only indicator the monitor has been  turned on.     Do not shower for the first 24 hours.  You may shower after the first 24 hours.   Press button if you feel a symptom. You will hear a small click.  Record Date, Time and Symptom in the Patient Log Book.   When you are ready to remove patch, follow instructions on last 2 pages of Patient Log Book.  Stick patch monitor onto last page of Patient Log Book.   Place Patient Log Book in [place] box.  Use locking tab on box and tape box closed securely.  The [employer] and [employer] has [employer] on it.  Please place in mailbox as soon as possible.  Your physician should have your test results approximately 7 days after the monitor has been mailed back to [facility].   Call [facility] Customer Care at [phone] if you have questions regarding your ZIO XT patch monitor.  Call them immediately if you see an orange light blinking on your monitor.   If your monitor falls off in less than 4 days contact our Monitor department at [phone].  If your monitor becomes loose or falls off after 4 days call [employer] at [phone] for suggestions on securing your monitor.  Follow-Up: Your physician recommends that you schedule a follow-up appointment in: 4-6 weeks   Any Other Special Instructions Will Be Listed Below (If Applicable).  If you need a refill on your  cardiac medications before your next appointment, please call your pharmacy.

## 2024-01-10 DIAGNOSIS — R002 Palpitations: Secondary | ICD-10-CM | POA: Diagnosis not present

## 2024-01-27 ENCOUNTER — Ambulatory Visit: Attending: Nurse Practitioner | Admitting: Nurse Practitioner

## 2024-01-27 ENCOUNTER — Telehealth: Payer: Self-pay | Admitting: Nurse Practitioner

## 2024-01-27 ENCOUNTER — Encounter: Payer: Self-pay | Admitting: Nurse Practitioner

## 2024-01-27 VITALS — BP 150/76 | HR 80 | Ht 68.0 in | Wt 194.4 lb

## 2024-01-27 DIAGNOSIS — I10 Essential (primary) hypertension: Secondary | ICD-10-CM | POA: Diagnosis not present

## 2024-01-27 DIAGNOSIS — E781 Pure hyperglyceridemia: Secondary | ICD-10-CM

## 2024-01-27 DIAGNOSIS — E7800 Pure hypercholesterolemia, unspecified: Secondary | ICD-10-CM | POA: Diagnosis not present

## 2024-01-27 DIAGNOSIS — I471 Supraventricular tachycardia, unspecified: Secondary | ICD-10-CM

## 2024-01-27 DIAGNOSIS — R002 Palpitations: Secondary | ICD-10-CM | POA: Diagnosis not present

## 2024-01-27 NOTE — Telephone Encounter (Signed)
  Patient Consent for Virtual Visit        Amanda Case has provided verbal consent on 01/27/2024 for a virtual visit (video or telephone).   CONSENT FOR VIRTUAL VISIT FOR:  Amanda Case  By participating in this virtual visit I agree to the following:  I hereby voluntarily request, consent and authorize [facility] and its employed or contracted physicians, physician assistants, nurse practitioners or other licensed health care professionals (the Practitioner), to provide me with telemedicine health care services (the "Services") as deemed necessary by the treating Practitioner. I acknowledge and consent to receive the Services by the Practitioner via telemedicine. I understand that the telemedicine visit will involve communicating with the Practitioner through live audiovisual communication technology and the disclosure of certain medical information by electronic transmission. I acknowledge that I have been given the opportunity to request an in-person assessment or other available alternative prior to the telemedicine visit and am voluntarily participating in the telemedicine visit.  I understand that I have the right to withhold or withdraw my consent to the use of telemedicine in the course of my care at any time, without affecting my right to future care or treatment, and that the Practitioner or I may terminate the telemedicine visit at any time. I understand that I have the right to inspect all information obtained and/or recorded in the course of the telemedicine visit and may receive copies of available information for a reasonable fee.  I understand that some of the potential risks of receiving the Services via telemedicine include:  Delay or interruption in medical evaluation due to technological equipment failure or disruption; Information transmitted may not be sufficient (e.g. poor resolution of images) to allow for appropriate medical decision making by the Practitioner;  and/or  In rare instances, security protocols could fail, causing a breach of personal health information.  Furthermore, I acknowledge that it is my responsibility to provide information about my medical history, conditions and care that is complete and accurate to the best of my ability. I acknowledge that Practitioner's advice, recommendations, and/or decision may be based on factors not within their control, such as incomplete or inaccurate data provided by me or distortions of diagnostic images or specimens that may result from electronic transmissions. I understand that the practice of medicine is not an exact science and that Practitioner makes no warranties or guarantees regarding treatment outcomes. I acknowledge that a copy of this consent can be made available to me via my patient portal [facility] MyChart), or I can request a printed copy by calling the office of [facility].    I understand that my insurance will be billed for this visit.   I have read or had this consent read to me. I understand the contents of this consent, which adequately explains the benefits and risks of the Services being provided via telemedicine.  I have been provided ample opportunity to ask questions regarding this consent and the Services and have had my questions answered to my satisfaction. I give my informed consent for the services to be provided through the use of telemedicine in my medical care

## 2024-01-27 NOTE — Patient Instructions (Addendum)
 Medication Instructions:  Your physician recommends that you continue on your current medications as directed. Please refer to the Current Medication list given to you today.   Labwork: None  Testing/Procedures: None  Follow-Up: Your physician recommends that you schedule a follow-up appointment in: 6 weeks Telephone visit  Any Other Special Instructions Will Be Listed Below (If Applicable).   If you need a refill on your cardiac medications before your next appointment, please call your pharmacy.

## 2024-01-27 NOTE — Progress Notes (Unsigned)
 Cardiology Office Note:  .   Date:  01/27/2024 ID:  Amanda Case, DOB 08/07/1946, MRN [id number] PCP: [facility] Providers Cardiologist:  Armida Lander, MD    History of Present Illness: .   Amanda Case is a 78 y.o. female with a PMH of palpitations, hypertension, hypercholesterolemia, past history of kidney stones, presents today for 4 to 6-week follow-up.  Last seen by Dr. Armida Lander on December 15, 2023.  At the time, she noted palpitations that is started about a year ago, noticed in the evenings when sitting down, occurring a few times per week, very brief in duration.  Did note some caffeine intake.  2 weeks ZIO monitor arranged-see preliminary report below.  Today she presents for follow-up. She states she continues to note palpitations in the evenings. Does note some caffeine intake. Denies any chest pain, shortness of breath, syncope, presyncope, dizziness, orthopnea, PND, swelling or significant weight changes, acute bleeding, or claudication.  ROS: Negative.  See HPI. FH: Her father died of a stroke at age 3 years old.  Studies Reviewed: [name]    EKG: EKG is not ordered today.  Cardiac monitor 12/2023 (preliminary monitor report):  Patch Wear Time:  13 days and 5 hours (2025-03-27T13:50:47-0400 to 2025-04-09T19:09:30-0400)   Patient had a min HR of 55 bpm, max HR of 188 bpm, and avg HR of 82 bpm. Predominant underlying rhythm was Sinus Rhythm. 65 Supraventricular Tachycardia runs occurred, the run with the fastest interval lasting 8 beats with a max rate of 188 bpm, the  longest lasting 22.0 secs with an avg rate of 129 bpm. Supraventricular Tachycardia was detected within +/- 45 seconds of symptomatic patient event(s). Isolated SVEs were rare (<1.0%), SVE Couplets were rare (<1.0%), and SVE Triplets were rare (<1.0%).  Isolated VEs were rare (<1.0%), VE Couplets were rare (<1.0%), and no VE Triplets were present. Ventricular Bigeminy and  Trigeminy were present.   Physical Exam:   VS:  BP 137/62 (BP Location: Left Arm)   Pulse 80   Ht 5\' 8"  (1.727 m)   Wt 194 lb 6.4 oz (88.2 kg)   SpO2 97%   BMI 29.56 kg/m    Wt Readings from Last 3 Encounters:  01/27/24 194 lb 6.4 oz (88.2 kg)  12/15/23 193 lb 6.4 oz (87.7 kg)  08/08/23 191 lb 3.2 oz (86.7 kg)    GEN: Well nourished, well developed in no acute distress NECK: No JVD; No carotid bruits CARDIAC: S1/S2, RRR, no murmurs, rubs, gallops RESPIRATORY:  Clear to auscultation without rales, wheezing or rhonchi  ABDOMEN: Soft, non-tender, non-distended EXTREMITIES:  No edema; No deformity   ASSESSMENT AND PLAN: .    Palpitations, PSVT Continues to note some palpitations.  I did discuss/review her preliminary cardiac monitor report, appears she is symptomatic with episodes of PSVT, overall episodes were brief in duration.  Did discuss treatment options.  Came to shared medical decision with patient that we will work on lifestyle modifications including reducing caffeine intake.  She will continue to monitor symptoms. Heart healthy diet and regular cardiovascular exercise encouraged.   HTN Blood pressure elevated on arrival, blood pressure within normal limits on recheck. Discussed to monitor BP at home at least 2 hours after medications and sitting for 5-10 minutes.  No medication changes at this time. Heart healthy diet and regular cardiovascular exercise encouraged.  Continue to follow with PCP.  Hypercholesterolemia, hypertriglyceridemia  LDL from last July was within normal limits, does have  elevated triglyceride count from last July.  Continue rosuvastatin . Heart healthy diet and regular cardiovascular exercise encouraged. Continue to follow with PCP.   Dispo: Follow-up with me/APP in 6 weeks via telephone visit or sooner if anything changes.  Signed, Lasalle Pointer, NP

## 2024-02-06 DIAGNOSIS — R002 Palpitations: Secondary | ICD-10-CM

## 2024-03-08 ENCOUNTER — Ambulatory Visit: Attending: Nurse Practitioner | Admitting: Nurse Practitioner

## 2024-03-08 ENCOUNTER — Encounter: Payer: Self-pay | Admitting: Nurse Practitioner

## 2024-03-08 ENCOUNTER — Telehealth: Payer: Self-pay

## 2024-03-08 VITALS — BP 152/68 | HR 78 | Ht 66.0 in | Wt 190.0 lb

## 2024-03-08 DIAGNOSIS — R002 Palpitations: Secondary | ICD-10-CM | POA: Diagnosis not present

## 2024-03-08 DIAGNOSIS — E7800 Pure hypercholesterolemia, unspecified: Secondary | ICD-10-CM

## 2024-03-08 DIAGNOSIS — I471 Supraventricular tachycardia, unspecified: Secondary | ICD-10-CM | POA: Diagnosis not present

## 2024-03-08 DIAGNOSIS — I10 Essential (primary) hypertension: Secondary | ICD-10-CM

## 2024-03-08 DIAGNOSIS — E781 Pure hyperglyceridemia: Secondary | ICD-10-CM

## 2024-03-08 MED ORDER — CARVEDILOL 3.125 MG PO TABS: 3.1250 mg | ORAL_TABLET | Freq: Two times a day (BID) | ORAL | 1 refills | Status: DC

## 2024-03-08 NOTE — Telephone Encounter (Signed)
  Patient Consent for Virtual Visit    Amanda Case has provided verbal consent on 03/08/2024 for a virtual visit (video or telephone).   CONSENT FOR VIRTUAL VISIT FOR:  Amanda Case  By participating in this virtual visit I agree to the following:  I hereby voluntarily request, consent and authorize [facility] and its employed or contracted physicians, physician assistants, nurse practitioners or other licensed health care professionals (the Practitioner), to provide me with telemedicine health care services (the "Services) as deemed necessary by the treating Practitioner. I acknowledge and consent to receive the Services by the Practitioner via telemedicine. I understand that the telemedicine visit will involve communicating with the Practitioner through live audiovisual communication technology and the disclosure of certain medical information by electronic transmission. I acknowledge that I have been given the opportunity to request an in-person assessment or other available alternative prior to the telemedicine visit and am voluntarily participating in the telemedicine visit.  I understand that I have the right to withhold or withdraw my consent to the use of telemedicine in the course of my care at any time, without affecting my right to future care or treatment, and that the Practitioner or I may terminate the telemedicine visit at any time. I understand that I have the right to inspect all information obtained and/or recorded in the course of the telemedicine visit and may receive copies of available information for a reasonable fee.  I understand that some of the potential risks of receiving the Services via telemedicine include:  Delay or interruption in medical evaluation due to technological equipment failure or disruption; Information transmitted may not be sufficient (e.g. poor resolution of images) to allow for appropriate medical decision making by the Practitioner; and/or   In rare instances, security protocols could fail, causing a breach of personal health information.  Furthermore, I acknowledge that it is my responsibility to provide information about my medical history, conditions and care that is complete and accurate to the best of my ability. I acknowledge that Practitioner's advice, recommendations, and/or decision may be based on factors not within their control, such as incomplete or inaccurate data provided by me or distortions of diagnostic images or specimens that may result from electronic transmissions. I understand that the practice of medicine is not an exact science and that Practitioner makes no warranties or guarantees regarding treatment outcomes. I acknowledge that a copy of this consent can be made available to me via my patient portal [facility] MyChart), or I can request a printed copy by calling the office of [facility].    I understand that my insurance will be billed for this visit.   I have read or had this consent read to me. I understand the contents of this consent, which adequately explains the benefits and risks of the Services being provided via telemedicine.  I have been provided ample opportunity to ask questions regarding this consent and the Services and have had my questions answered to my satisfaction. I give my informed consent for the services to be provided through the use of telemedicine in my medical care

## 2024-03-08 NOTE — Patient Instructions (Addendum)
 Medication Instructions:  Your physician has recommended you make the following change in your medication:   Please start coreg 3.125 Mg Twice daily  (Your medication has been sent to your preferred pharmacy)   Labwork: None   Testing/Procedures: None   Follow-Up: Your physician recommends that you schedule a follow-up appointment in: 6 Months   Any Other Special Instructions Will Be Listed Below (If Applicable).  If you need a refill on your cardiac medications before your next appointment, please call your pharmacy.

## 2024-03-08 NOTE — Progress Notes (Unsigned)
 Cardiology Office Note:  .   Date:  01/27/2024 ID:  JOELLA SAEFONG, DOB 05/20/1946, MRN [id number] PCP: [facility] Providers Cardiologist:  Armida Lander, MD    History of Present Illness: .   Amanda Case is a 78 y.o. female with a PMH of palpitations, hypertension, hypercholesterolemia, past history of kidney stones, presents today for 4 to 6-week follow-up.  Last seen by Dr. Armida Lander on December 15, 2023.  At the time, she noted palpitations that is started about a year ago, noticed in the evenings when sitting down, occurring a few times per week, very brief in duration.  Did note some caffeine intake.  2 weeks ZIO monitor arranged-see preliminary report below.  Today she presents for follow-up. She states she continues to note palpitations in the evenings. Does note some caffeine intake. Denies any chest pain, shortness of breath, syncope, presyncope, dizziness, orthopnea, PND, swelling or significant weight changes, acute bleeding, or claudication.  She is here for follow-up via telephone visit.  She has decreased her caffeine intake as discussed from last office visit and has been doing better.  Continues to admit to some palpitations noticed.  Went over and reviewed her past blood pressure log.  Does have some elevated BPs noted at home.  Says she believes she has whitecoat syndrome.  Discussed starting Coreg 3.125 mg twice daily for blood pressure and palpitations management.  She verbalized understanding is agreeable to begin this medicine.  She will update us  in the next few weeks about how she is doing.  She will be planning to see her primary care soon for her annual physical with lab work early July.  I have requested that these results are faxed to our office.  She verbalized understanding.  Will follow-up in 6 months.  ROS: Negative.  See HPI. FH: Her father died of a stroke at age 73 years old.  Studies Reviewed: [name]    EKG: EKG is not  ordered today.  Cardiac monitor 12/2023 (preliminary monitor report):  Patch Wear Time:  13 days and 5 hours (2025-03-27T13:50:47-0400 to 2025-04-09T19:09:30-0400)   Patient had a min HR of 55 bpm, max HR of 188 bpm, and avg HR of 82 bpm. Predominant underlying rhythm was Sinus Rhythm. 65 Supraventricular Tachycardia runs occurred, the run with the fastest interval lasting 8 beats with a max rate of 188 bpm, the  longest lasting 22.0 secs with an avg rate of 129 bpm. Supraventricular Tachycardia was detected within +/- 45 seconds of symptomatic patient event(s). Isolated SVEs were rare (<1.0%), SVE Couplets were rare (<1.0%), and SVE Triplets were rare (<1.0%).  Isolated VEs were rare (<1.0%), VE Couplets were rare (<1.0%), and no VE Triplets were present. Ventricular Bigeminy and Trigeminy were present.   Physical Exam:   VS:  BP (!) 152/68   Pulse 78   Ht 5' 6 (1.676 m)   Wt 190 lb (86.2 kg)   BMI 30.67 kg/m    Wt Readings from Last 3 Encounters:  03/08/24 190 lb (86.2 kg)  01/27/24 194 lb 6.4 oz (88.2 kg)  12/15/23 193 lb 6.4 oz (87.7 kg)    GEN: Well nourished, well developed in no acute distress NECK: No JVD; No carotid bruits CARDIAC: S1/S2, RRR, no murmurs, rubs, gallops RESPIRATORY:  Clear to auscultation without rales, wheezing or rhonchi  ABDOMEN: Soft, non-tender, non-distended EXTREMITIES:  No edema; No deformity   ASSESSMENT AND PLAN: .    Palpitations, PSVT Continues to  note some palpitations.  I did discuss/review her preliminary cardiac monitor report, appears she is symptomatic with episodes of PSVT, overall episodes were brief in duration.  Did discuss treatment options. Came to shared medical decision with patient that we will work on lifestyle modifications including reducing caffeine intake.  She will continue to monitor symptoms. Heart healthy diet and regular cardiovascular exercise encouraged.   HTN Blood pressure elevated on arrival, blood pressure within  normal limits on recheck. Discussed to monitor BP at home at least 2 hours after medications and sitting for 5-10 minutes.  No medication changes at this time. Heart healthy diet and regular cardiovascular exercise encouraged.  Continue to follow with PCP.  Hypercholesterolemia, hypertriglyceridemia  LDL from last July was within normal limits, does have elevated triglyceride count from last July.  Continue rosuvastatin . Heart healthy diet and regular cardiovascular exercise encouraged. Continue to follow with PCP.   Doing pretty good. Caffeine may have helped some...    Coreg 3.125 mg bid.   14 minutes...   Dispo: Follow-up with me/APP in 6 weeks via telephone visit or sooner if anything changes.  Signed, Lasalle Pointer, NP

## 2024-03-21 DIAGNOSIS — I700 Atherosclerosis of aorta: Secondary | ICD-10-CM | POA: Diagnosis not present

## 2024-03-21 DIAGNOSIS — Z1389 Encounter for screening for other disorder: Secondary | ICD-10-CM | POA: Diagnosis not present

## 2024-03-21 DIAGNOSIS — Z299 Encounter for prophylactic measures, unspecified: Secondary | ICD-10-CM | POA: Diagnosis not present

## 2024-03-21 DIAGNOSIS — Z7189 Other specified counseling: Secondary | ICD-10-CM | POA: Diagnosis not present

## 2024-03-21 DIAGNOSIS — R5383 Other fatigue: Secondary | ICD-10-CM | POA: Diagnosis not present

## 2024-03-21 DIAGNOSIS — R739 Hyperglycemia, unspecified: Secondary | ICD-10-CM | POA: Diagnosis not present

## 2024-03-21 DIAGNOSIS — Z713 Dietary counseling and surveillance: Secondary | ICD-10-CM | POA: Diagnosis not present

## 2024-03-21 DIAGNOSIS — Z0000 Encounter for general adult medical examination without abnormal findings: Secondary | ICD-10-CM | POA: Diagnosis not present

## 2024-03-21 DIAGNOSIS — Z79899 Other long term (current) drug therapy: Secondary | ICD-10-CM | POA: Diagnosis not present

## 2024-03-21 DIAGNOSIS — I10 Essential (primary) hypertension: Secondary | ICD-10-CM | POA: Diagnosis not present

## 2024-04-09 ENCOUNTER — Ambulatory Visit: Payer: Self-pay | Admitting: Cardiology

## 2024-04-19 DIAGNOSIS — Z299 Encounter for prophylactic measures, unspecified: Secondary | ICD-10-CM | POA: Diagnosis not present

## 2024-04-19 DIAGNOSIS — I10 Essential (primary) hypertension: Secondary | ICD-10-CM | POA: Diagnosis not present

## 2024-04-19 DIAGNOSIS — Z0000 Encounter for general adult medical examination without abnormal findings: Secondary | ICD-10-CM | POA: Diagnosis not present

## 2024-04-19 DIAGNOSIS — N1832 Chronic kidney disease, stage 3b: Secondary | ICD-10-CM | POA: Diagnosis not present

## 2024-04-23 MED ORDER — METOPROLOL SUCCINATE ER 25 MG PO TB24: 25.0000 mg | ORAL_TABLET | Freq: Every day | ORAL | 1 refills | Status: DC

## 2024-08-07 ENCOUNTER — Ambulatory Visit (HOSPITAL_COMMUNITY)
Admission: RE | Admit: 2024-08-07 | Discharge: 2024-08-07 | Disposition: A | Discharge status: Home / Self Care | Payer: Medicare Other | Source: Ambulatory Visit | Attending: Urology | Admitting: Urology

## 2024-08-07 DIAGNOSIS — N131 Hydronephrosis with ureteral stricture, not elsewhere classified: Secondary | ICD-10-CM | POA: Insufficient documentation

## 2024-08-24 ENCOUNTER — Ambulatory Visit: Payer: Medicare Other | Admitting: Urology

## 2024-08-28 ENCOUNTER — Ambulatory Visit: Admitting: Nurse Practitioner

## 2024-10-11 ENCOUNTER — Other Ambulatory Visit: Payer: Self-pay | Admitting: Nurse Practitioner

## 2024-10-25 ENCOUNTER — Ambulatory Visit: Admitting: Cardiology

## 2025-01-10 ENCOUNTER — Ambulatory Visit: Admitting: Nurse Practitioner

## 2025-02-20 ENCOUNTER — Ambulatory Visit: Admitting: Urology
